# Patient Record
Sex: Male | Born: 1960
Health system: Southern US, Community
[De-identification: ages and names within clinical notes are randomized; demographics above are authoritative.]

## PROBLEM LIST (undated history)

## (undated) DIAGNOSIS — I82409 Acute embolism and thrombosis of unspecified deep veins of unspecified lower extremity: Secondary | ICD-10-CM

## (undated) DIAGNOSIS — I10 Essential (primary) hypertension: Secondary | ICD-10-CM

## (undated) HISTORY — PX: OTHER SURGICAL HISTORY: SHX169

## (undated) HISTORY — DX: Essential (primary) hypertension: I10

---

## 2004-03-30 ENCOUNTER — Emergency Department (HOSPITAL_COMMUNITY): Admission: EM | Admit: 2004-03-30 | Discharge: 2004-03-30 | Payer: Self-pay | Admitting: Emergency Medicine

## 2011-01-21 ENCOUNTER — Emergency Department (HOSPITAL_COMMUNITY)
Admission: EM | Admit: 2011-01-21 | Discharge: 2011-01-22 | Disposition: A | Discharge status: Home / Self Care | Payer: 59 | Attending: Emergency Medicine | Admitting: Emergency Medicine

## 2011-01-21 DIAGNOSIS — R05 Cough: Secondary | ICD-10-CM | POA: Insufficient documentation

## 2011-01-21 DIAGNOSIS — I10 Essential (primary) hypertension: Secondary | ICD-10-CM | POA: Insufficient documentation

## 2011-01-21 DIAGNOSIS — R059 Cough, unspecified: Secondary | ICD-10-CM | POA: Insufficient documentation

## 2011-01-21 DIAGNOSIS — R079 Chest pain, unspecified: Secondary | ICD-10-CM | POA: Insufficient documentation

## 2011-01-21 DIAGNOSIS — J40 Bronchitis, not specified as acute or chronic: Secondary | ICD-10-CM | POA: Insufficient documentation

## 2011-01-22 ENCOUNTER — Emergency Department (HOSPITAL_COMMUNITY): Payer: 59

## 2012-07-30 ENCOUNTER — Ambulatory Visit (INDEPENDENT_AMBULATORY_CARE_PROVIDER_SITE_OTHER): Payer: 59 | Admitting: Family Medicine

## 2012-07-30 VITALS — BP 162/102 | HR 59 | Temp 97.6°F | Resp 18 | Ht 67.0 in | Wt 301.4 lb

## 2012-07-30 DIAGNOSIS — M79669 Pain in unspecified lower leg: Secondary | ICD-10-CM

## 2012-07-30 DIAGNOSIS — L0390 Cellulitis, unspecified: Secondary | ICD-10-CM

## 2012-07-30 DIAGNOSIS — R600 Localized edema: Secondary | ICD-10-CM

## 2012-07-30 DIAGNOSIS — I10 Essential (primary) hypertension: Secondary | ICD-10-CM

## 2012-07-30 DIAGNOSIS — R609 Edema, unspecified: Secondary | ICD-10-CM

## 2012-07-30 DIAGNOSIS — M79609 Pain in unspecified limb: Secondary | ICD-10-CM

## 2012-07-30 LAB — POCT CBC
MCH, POC: 27.9 pg (ref 27–31.2)
MCHC: 30.6 g/dL — AB (ref 31.8–35.4)
MCV: 91.3 fL (ref 80–97)
POC MID %: 7 %M (ref 0–12)
Platelet Count, POC: 260 10*3/uL (ref 142–424)
RBC: 4.94 M/uL (ref 4.69–6.13)
RDW, POC: 15.2 %

## 2012-07-30 LAB — COMPREHENSIVE METABOLIC PANEL
ALT: 9 U/L (ref 0–53)
AST: 16 U/L (ref 0–37)
Albumin: 4 g/dL (ref 3.5–5.2)
BUN: 13 mg/dL (ref 6–23)
CO2: 29 mEq/L (ref 19–32)
Calcium: 9.4 mg/dL (ref 8.4–10.5)
Chloride: 105 mEq/L (ref 96–112)
Glucose, Bld: 90 mg/dL (ref 70–99)
Potassium: 4.2 mEq/L (ref 3.5–5.3)
Total Protein: 7.6 g/dL (ref 6.0–8.3)

## 2012-07-30 MED ORDER — CEPHALEXIN 500 MG PO CAPS: 500.0000 mg | ORAL_CAPSULE | Freq: Four times a day (QID) | ORAL | Status: DC

## 2012-07-30 MED ORDER — AMLODIPINE-VALSARTAN-HCTZ 10-160-25 MG PO TABS: ORAL_TABLET | ORAL | Status: DC

## 2012-07-30 NOTE — Progress Notes (Signed)
Subjective: Patient has had cellulitis developing in his left ankle for for 5 days. He has been out of his blood pressure medication for over a week, waiting for the mail order prescription to come in. His legs have been swelling more. He develops spontaneously erythema of the left shin. He had a cellulitis that once in the past. Knows of no injury. 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 He is on blood pressure medications and not on any other regular meds. He the area of erythema is painful, but there is no pain going up the leg above that.    Objective: Obese Afro-American male in no acute distress. No calf or thigh tenderness. He is tender in the left shin area. There is 2+ pitting edema. Pedal pulses present. Sensory grossly intact.  Assessment: Cellulitis left lower leg Obesity Hypertension uncontrolled  Plan: Need to get back on his blood pressure medicine. He is to get the prescription I gave him filled if his mail order prescriptions and recommend.  Check CBC and complete metabolic panel on him  Results for orders placed in visit on 07/30/12  POCT CBC      Component Value Range   WBC 8.2  4.6 - 10.2 K/uL   Lymph, poc 2.3  0.6 - 3.4   POC LYMPH PERCENT 28.2  10 - 50 %L   MID (cbc) 0.6  0 - 0.9   POC MID % 7.0  0 - 12 %M   POC Granulocyte 5.3  2 - 6.9   Granulocyte percent 64.8  37 - 80 %G   RBC 4.94  4.69 - 6.13 M/uL   Hemoglobin 13.8 (*) 14.1 - 18.1 g/dL   HCT, POC 45.4  09.8 - 53.7 %   MCV 91.3  80 - 97 fL   MCH, POC 27.9  27 - 31.2 pg   MCHC 30.6 (*) 31.8 - 35.4 g/dL   RDW, POC 11.9     Platelet Count, POC 260  142 - 424 K/uL   MPV 9.3  0 - 99.8 fL

## 2012-07-30 NOTE — Patient Instructions (Addendum)
Try to keep leg elevated as much as possible.  Take the antibiotic one pill 3 times daily  If not looking much better in the next 2-3 days return for recheck. If looking worse at anytime return.

## 2012-08-02 ENCOUNTER — Encounter: Payer: Self-pay | Admitting: Family Medicine

## 2012-09-17 ENCOUNTER — Other Ambulatory Visit: Payer: Self-pay | Admitting: Family Medicine

## 2012-09-17 ENCOUNTER — Other Ambulatory Visit: Payer: 59

## 2012-09-17 DIAGNOSIS — R52 Pain, unspecified: Secondary | ICD-10-CM

## 2012-09-17 DIAGNOSIS — R609 Edema, unspecified: Secondary | ICD-10-CM

## 2012-09-18 ENCOUNTER — Other Ambulatory Visit: Payer: 59

## 2012-09-24 ENCOUNTER — Ambulatory Visit
Admission: RE | Admit: 2012-09-24 | Discharge: 2012-09-24 | Disposition: A | Discharge status: Home / Self Care | Payer: 59 | Source: Ambulatory Visit | Attending: Family Medicine | Admitting: Family Medicine

## 2012-09-24 DIAGNOSIS — R52 Pain, unspecified: Secondary | ICD-10-CM

## 2012-09-24 DIAGNOSIS — R609 Edema, unspecified: Secondary | ICD-10-CM

## 2013-10-04 ENCOUNTER — Ambulatory Visit (INDEPENDENT_AMBULATORY_CARE_PROVIDER_SITE_OTHER): Payer: 59 | Admitting: Family Medicine

## 2013-10-04 ENCOUNTER — Ambulatory Visit: Payer: 59

## 2013-10-04 ENCOUNTER — Encounter: Payer: Self-pay | Admitting: Family Medicine

## 2013-10-04 VITALS — BP 162/90 | HR 102 | Temp 100.1°F | Resp 17 | Ht 67.0 in | Wt 315.0 lb

## 2013-10-04 DIAGNOSIS — R509 Fever, unspecified: Secondary | ICD-10-CM

## 2013-10-04 DIAGNOSIS — R05 Cough: Secondary | ICD-10-CM

## 2013-10-04 DIAGNOSIS — R059 Cough, unspecified: Secondary | ICD-10-CM

## 2013-10-04 DIAGNOSIS — E669 Obesity, unspecified: Secondary | ICD-10-CM

## 2013-10-04 DIAGNOSIS — I10 Essential (primary) hypertension: Secondary | ICD-10-CM

## 2013-10-04 DIAGNOSIS — J189 Pneumonia, unspecified organism: Secondary | ICD-10-CM

## 2013-10-04 LAB — POCT CBC
Granulocyte percent: 88.5 %G — AB (ref 37–80)
HCT, POC: 48.3 % (ref 43.5–53.7)
Hemoglobin: 15 g/dL (ref 14.1–18.1)
LYMPH, POC: 0.9 (ref 0.6–3.4)
MCH: 28.5 pg (ref 27–31.2)
MCHC: 31.1 g/dL — AB (ref 31.8–35.4)
MCV: 91.8 fL (ref 80–97)
MID (cbc): 0.5 (ref 0–0.9)
MPV: 10.2 fL (ref 0–99.8)
POC GRANULOCYTE: 10.9 — AB (ref 2–6.9)
POC LYMPH %: 7.5 % — AB (ref 10–50)
POC MID %: 4 %M (ref 0–12)
Platelet Count, POC: 188 10*3/uL (ref 142–424)
RBC: 5.26 M/uL (ref 4.69–6.13)
RDW, POC: 15.4 %
WBC: 12.3 10*3/uL — AB (ref 4.6–10.2)

## 2013-10-04 LAB — POCT INFLUENZA A/B
INFLUENZA A, POC: NEGATIVE
INFLUENZA B, POC: NEGATIVE

## 2013-10-04 MED ORDER — ACETAMINOPHEN 325 MG PO TABS: 1000.0000 mg | ORAL_TABLET | Freq: Once | ORAL | Status: DC

## 2013-10-04 MED ORDER — CEFTRIAXONE SODIUM 1 G IJ SOLR
1.0000 g | Freq: Once | INTRAMUSCULAR | Status: AC
  Administered 2013-10-04: 1 g via INTRAMUSCULAR

## 2013-10-04 MED ORDER — LEVOFLOXACIN 500 MG PO TABS: 500.0000 mg | ORAL_TABLET | Freq: Every day | ORAL | Status: DC

## 2013-10-04 NOTE — Progress Notes (Signed)
[facility] [address] [place] [address] [phone]  Date:  10/04/2013   Name:  Richard Leon   DOB:  [birth year]   MRN:  [id number]  PCP:  Provider Not In System    Chief Complaint: Fever, Chills, Generalized Body Aches and Nausea   History of Present Illness:  Richard Leon is a 53 y.o. very pleasant male patient who presents with the following:  Here today with illness. History of HTN and obesity but otherwise generally healthy.  He has noted sneezing, cough, HA, chills, fever, diarrhea, nausea (no vomiting).  These sx started 3 days ago.  No vomiting. He was not sure if he might have the flu or something else.   He has not taken any meds today.  Here today with his wife  Never a smoker  Given 1,000 mg of tylenol at clinic   There are no active problems to display for this patient.   Past Medical History  Diagnosis Date  . Hypertension     History reviewed. No pertinent past surgical history.  History  Substance Use Topics  . Smoking status: Never Smoker   . Smokeless tobacco: Not on file  . Alcohol Use: No    Family History  Problem Relation Age of Onset  . Arthritis Mother   . Hypertension Mother   . COPD Mother   . Kidney disease Sister   . Cancer Brother     No Known Allergies  Medication list has been reviewed and updated.  Current Outpatient Prescriptions on File Prior to Visit  Medication Sig Dispense Refill  . Amlodipine-Valsartan-HCTZ (EXFORGE HCT PO) Take by mouth daily. Patient takes 5-160-20 mg tablet po daily      . Amlodipine-Valsartan-HCTZ 10-160-25 MG TABS Take one daily for blood pressure  15 tablet  0  . cephALEXin (KEFLEX) 500 MG capsule Take 1 capsule (500 mg total) by mouth 4 (four) times daily.  30 capsule  0   No current facility-administered medications on file prior to visit.    Review of Systems:  As per HPI- otherwise negative.   Physical Examination: Filed Vitals:   10/04/13 0937   BP: 162/90  Pulse: 108  Temp: 102.1 F (38.9 C)  Resp: 17   Filed Vitals:   10/04/13 0937  Height: 5\' 7"  (1.702 m)  Weight: 315 lb (142.883 kg)   Body mass index is 49.32 kg/(m^2). Ideal Body Weight: Weight in (lb) to have BMI = 25: 159.3  GEN: WDWN, NAD, Non-toxic, A & O x 3, obese HEENT: Atraumatic, Normocephalic. Neck supple. No masses, No LAD.  Bilateral TM wnl, oropharynx normal.  PEERL,EOMI.   Ears and Nose: No external deformity. CV: RRR, No M/G/R. No JVD. No thrill. No extra heart sounds. PULM: CTA B, no wheezes, crackles, rhonchi. No retractions. No resp. distress. No accessory muscle use. ABD: S, NT, ND, +BS. No rebound. No HSM. EXTR: No c/c/e NEURO Normal gait.  PSYCH: Normally interactive. Conversant. Not depressed or anxious appearing.  Calm demeanor.   [facility] reading (PRIMARY) by  Dr. Patsy Lager. CXR: right lower lobe pneumonia  CHEST 2 VIEW  COMPARISON: 01/22/2011  FINDINGS: Enlargement of cardiac silhouette.  Tortuous aorta.  Sub pulmonary vascular congestion.  Mediastinal contours otherwise stable, with slight prominence of right superior mediastinal soft tissues unchanged.  Minimal atelectasis versus infiltrate at both lung bases.  Remaining lungs clear.  No pleural effusion or pneumothorax.  Multilevel endplate spur formation thoracic spine.  IMPRESSION: Enlargement  of cardiac silhouette.  Minimal atelectasis versus infiltrate at lung bases.  Results for orders placed in visit on 10/04/13  POCT INFLUENZA A/B      Result Value Range   Influenza A, POC Negative     Influenza B, POC Negative    POCT CBC      Result Value Range   WBC 12.3 (*) 4.6 - 10.2 K/uL   Lymph, poc 0.9  0.6 - 3.4   POC LYMPH PERCENT 7.5 (*) 10 - 50 %L   MID (cbc) 0.5  0 - 0.9   POC MID % 4.0  0 - 12 %M   POC Granulocyte 10.9 (*) 2 - 6.9   Granulocyte percent 88.5 (*) 37 - 80 %G   RBC 5.26  4.69 - 6.13 M/uL   Hemoglobin 15.0  14.1 - 18.1 g/dL   HCT, POC 16.148.3  09.643.5  - 53.7 %   MCV 91.8  80 - 97 fL   MCH, POC 28.5  27 - 31.2 pg   MCHC 31.1 (*) 31.8 - 35.4 g/dL   RDW, POC 04.515.4     Platelet Count, POC 188  142 - 424 K/uL   MPV 10.2  0 - 99.8 fL   Pulse and temp better after tylenol Assessment and Plan: CAP (community acquired pneumonia) - Plan: cefTRIAXone (ROCEPHIN) injection 1 g, levofloxacin (LEVAQUIN) 500 MG tablet  Fever, unspecified - Plan: POCT Influenza A/B, acetaminophen (TYLENOL) tablet 975 mg, POCT CBC, DG Chest 2 View  Cough - Plan: DG Chest 2 View  HTN (hypertension)  Obesity, unspecified  CAP treated with rocephin and levaquin.  He is not aware of any cardiac problems.   Close follow-up if not better.    Signed Abbe AmsterdamJessica Lorenzo Pereyra, MD

## 2013-10-04 NOTE — Patient Instructions (Addendum)
It does appear that you have pneumonia.  Rest and drink plenty of fluids.  If you are not feeling better in the next couple of days please let me know.  If you are getting worse please come in right away.    Use OTC medications as needed for your cough and fever You got a shot of antibiotics called rocephin today. Start taking your levaquin oral antibiotic this evening.

## 2013-12-11 ENCOUNTER — Other Ambulatory Visit (HOSPITAL_COMMUNITY): Payer: Self-pay | Admitting: Internal Medicine

## 2013-12-11 ENCOUNTER — Ambulatory Visit (HOSPITAL_COMMUNITY)
Admission: RE | Admit: 2013-12-11 | Discharge: 2013-12-11 | Disposition: A | Discharge status: Home / Self Care | Payer: 59 | Source: Ambulatory Visit | Attending: Internal Medicine | Admitting: Internal Medicine

## 2013-12-11 DIAGNOSIS — R52 Pain, unspecified: Secondary | ICD-10-CM

## 2013-12-11 DIAGNOSIS — M25469 Effusion, unspecified knee: Secondary | ICD-10-CM | POA: Insufficient documentation

## 2013-12-11 DIAGNOSIS — M25569 Pain in unspecified knee: Secondary | ICD-10-CM | POA: Insufficient documentation

## 2014-01-02 ENCOUNTER — Encounter: Payer: Self-pay | Admitting: Internal Medicine

## 2014-01-23 ENCOUNTER — Encounter (HOSPITAL_COMMUNITY): Payer: Self-pay | Admitting: Emergency Medicine

## 2014-01-23 ENCOUNTER — Emergency Department (HOSPITAL_COMMUNITY)
Admission: EM | Admit: 2014-01-23 | Discharge: 2014-01-23 | Disposition: A | Discharge status: Home / Self Care | Payer: 59 | Attending: Emergency Medicine | Admitting: Emergency Medicine

## 2014-01-23 ENCOUNTER — Emergency Department (HOSPITAL_COMMUNITY): Payer: 59

## 2014-01-23 DIAGNOSIS — M1711 Unilateral primary osteoarthritis, right knee: Secondary | ICD-10-CM

## 2014-01-23 DIAGNOSIS — M1710 Unilateral primary osteoarthritis, unspecified knee: Secondary | ICD-10-CM | POA: Insufficient documentation

## 2014-01-23 DIAGNOSIS — IMO0002 Reserved for concepts with insufficient information to code with codable children: Secondary | ICD-10-CM

## 2014-01-23 DIAGNOSIS — R5381 Other malaise: Secondary | ICD-10-CM | POA: Insufficient documentation

## 2014-01-23 DIAGNOSIS — R55 Syncope and collapse: Secondary | ICD-10-CM | POA: Insufficient documentation

## 2014-01-23 DIAGNOSIS — I517 Cardiomegaly: Secondary | ICD-10-CM | POA: Insufficient documentation

## 2014-01-23 DIAGNOSIS — Z79899 Other long term (current) drug therapy: Secondary | ICD-10-CM | POA: Insufficient documentation

## 2014-01-23 DIAGNOSIS — I10 Essential (primary) hypertension: Secondary | ICD-10-CM | POA: Insufficient documentation

## 2014-01-23 DIAGNOSIS — E663 Overweight: Secondary | ICD-10-CM | POA: Insufficient documentation

## 2014-01-23 DIAGNOSIS — R110 Nausea: Secondary | ICD-10-CM | POA: Insufficient documentation

## 2014-01-23 DIAGNOSIS — E669 Obesity, unspecified: Secondary | ICD-10-CM | POA: Insufficient documentation

## 2014-01-23 DIAGNOSIS — Z791 Long term (current) use of non-steroidal anti-inflammatories (NSAID): Secondary | ICD-10-CM | POA: Insufficient documentation

## 2014-01-23 DIAGNOSIS — R61 Generalized hyperhidrosis: Secondary | ICD-10-CM | POA: Insufficient documentation

## 2014-01-23 DIAGNOSIS — R5383 Other fatigue: Secondary | ICD-10-CM

## 2014-01-23 LAB — URINALYSIS, ROUTINE W REFLEX MICROSCOPIC
BILIRUBIN URINE: NEGATIVE
GLUCOSE, UA: NEGATIVE mg/dL
Hgb urine dipstick: NEGATIVE
Ketones, ur: NEGATIVE mg/dL
Leukocytes, UA: NEGATIVE
Nitrite: NEGATIVE
PH: 5.5 (ref 5.0–8.0)
PROTEIN: NEGATIVE mg/dL
Specific Gravity, Urine: 1.015 (ref 1.005–1.030)
Urobilinogen, UA: 1 mg/dL (ref 0.0–1.0)

## 2014-01-23 LAB — HEPATIC FUNCTION PANEL
ALT: 10 U/L (ref 0–53)
AST: 16 U/L (ref 0–37)
Albumin: 3.3 g/dL — ABNORMAL LOW (ref 3.5–5.2)
Alkaline Phosphatase: 72 U/L (ref 39–117)
Total Bilirubin: 0.5 mg/dL (ref 0.3–1.2)
Total Protein: 7.2 g/dL (ref 6.0–8.3)

## 2014-01-23 LAB — I-STAT TROPONIN, ED
Troponin i, poc: 0 ng/mL (ref 0.00–0.08)
Troponin i, poc: 0 ng/mL (ref 0.00–0.08)

## 2014-01-23 LAB — CBC
HCT: 41.2 % (ref 39.0–52.0)
Hemoglobin: 13.7 g/dL (ref 13.0–17.0)
MCH: 28.8 pg (ref 26.0–34.0)
MCHC: 33.3 g/dL (ref 30.0–36.0)
MCV: 86.7 fL (ref 78.0–100.0)
Platelets: 185 10*3/uL (ref 150–400)
RBC: 4.75 MIL/uL (ref 4.22–5.81)
RDW: 15.2 % (ref 11.5–15.5)
WBC: 7.9 10*3/uL (ref 4.0–10.5)

## 2014-01-23 LAB — BASIC METABOLIC PANEL
BUN: 16 mg/dL (ref 6–23)
CALCIUM: 9.1 mg/dL (ref 8.4–10.5)
CHLORIDE: 102 meq/L (ref 96–112)
CO2: 27 meq/L (ref 19–32)
CREATININE: 0.97 mg/dL (ref 0.50–1.35)
GFR calc Af Amer: >90 mL/min (ref >90)
GFR calc non Af Amer: >90 mL/min (ref >90)
GLUCOSE: 86 mg/dL (ref 70–99)
Potassium: 3.8 mEq/L (ref 3.7–5.3)
Sodium: 139 mEq/L (ref 137–147)

## 2014-01-23 LAB — CBG MONITORING, ED: Glucose-Capillary: 81 mg/dL (ref 70–99)

## 2014-01-23 LAB — PRO B NATRIURETIC PEPTIDE: Pro B Natriuretic peptide (BNP): 12.8 pg/mL (ref 0–125)

## 2014-01-23 LAB — PROTIME-INR
INR: 0.94 (ref 0.00–1.49)
Prothrombin Time: 12.4 seconds (ref 11.6–15.2)

## 2014-01-23 MED ORDER — SODIUM CHLORIDE 0.9 % IV SOLN
1000.0000 mL | Freq: Once | INTRAVENOUS | Status: AC
  Administered 2014-01-23: 1000 mL via INTRAVENOUS

## 2014-01-23 MED ORDER — SODIUM CHLORIDE 0.9 % IV SOLN
1000.0000 mL | INTRAVENOUS | Status: DC
  Administered 2014-01-23: 1000 mL via INTRAVENOUS

## 2014-01-23 NOTE — ED Notes (Signed)
Pt from work, states near syncopal episode during Loss adjuster, charteredfire drill. Per EMS pt nauseated, weak and diaphoretic upon there arrival, No LOC. Pt initial cbg 64. NSD at this time

## 2014-01-23 NOTE — ED Provider Notes (Signed)
CSN: [id number]     Arrival date & time 01/23/14  1158 History   First MD Initiated Contact with Patient 01/23/14 1201     Chief Complaint  Patient presents with  . Near Syncope     (Consider location/radiation/quality/duration/timing/severity/associated sxs/prior Treatment)  HPI  Skip EstimableStevenson L Richard Leon is a 53 y.o. male with past medical history of hypertension brought in by EMS after near syncopal event. Patient was at work and had a Loss adjuster, charteredfire drill where they were waiting outside in the heat for about 15 minutes. In the elevator ride back into his office he felt weak, sweaty, nauseous and was gently eased to the floor. The sensation passed on its own. He denies any palpitations, chest pain, shortness of breath, nausea vomiting. Patient has not had any cardiac history. Has never had a stress test.  PCP [place]ue[place]  Past Medical History  Diagnosis Date  . Hypertension    History reviewed. No pertinent past surgical history. Family History  Problem Relation Age of Onset  . Arthritis Mother   . Hypertension Mother   . COPD Mother   . Kidney disease Sister   . Cancer Brother    History  Substance Use Topics  . Smoking status: Never Smoker   . Smokeless tobacco: Not on file  . Alcohol Use: No    Review of Systems  10 systems reviewed and found to be negative, except as noted in the HPI.  Allergies  Review of patient's allergies indicates no known allergies.  Home Medications   Prior to Admission medications   Medication Sig Start Date End Date Taking? Authorizing Provider  Amlodipine-Valsartan-HCTZ (EXFORGE HCT PO) Take by mouth daily. Patient takes 5-160-20 mg tablet po daily   Yes Historical Provider, MD  meloxicam (MOBIC) 15 MG tablet Take 15 mg by mouth daily.   Yes Historical Provider, MD   BP 149/74  Pulse 66  Temp(Src) 98.4 F (36.9 C) (Oral)  Resp 17  SpO2 100% Physical Exam  Nursing note and vitals reviewed. Constitutional: He is oriented to person, place, and time.  He appears well-developed and well-nourished. No distress.  overweight  HENT:  Head: Normocephalic.  Mouth/Throat: Oropharynx is clear and moist.  Eyes: Conjunctivae and EOM are normal. Pupils are equal, round, and reactive to light.  Neck: Normal range of motion. Neck supple.  Cardiovascular: Normal rate, regular rhythm, normal heart sounds and intact distal pulses.   Pulmonary/Chest: Effort normal and breath sounds normal. No stridor. No respiratory distress. He has no wheezes. He has no rales. He exhibits no tenderness.  Abdominal: Soft. Bowel sounds are normal. He exhibits no distension and no mass. There is no tenderness. There is no rebound and no guarding.  Musculoskeletal: Normal range of motion. He exhibits no edema and no tenderness.  Neurological: He is alert and oriented to person, place, and time.  Skin: Skin is warm.  Psychiatric: He has a normal mood and affect.    ED Course  Procedures (including critical care time) Labs Review Labs Reviewed  HEPATIC FUNCTION PANEL - Abnormal; Notable for the following:    Albumin 3.3 (*)    All other components within normal limits  CBC  BASIC METABOLIC PANEL  PROTIME-INR  URINALYSIS, ROUTINE W REFLEX MICROSCOPIC  PRO B NATRIURETIC PEPTIDE  CBG MONITORING, ED  I-STAT TROPOININ, ED  POCT CBG (FASTING - GLUCOSE)-MANUAL ENTRY  Rosezena SensorI-STAT TROPOININ, ED    Imaging Review Dg Chest Port 1 View  01/23/2014   CLINICAL DATA:  Shortness of breath.  EXAM: PORTABLE CHEST - 1 VIEW  COMPARISON:  Chest x-ray 10/04/2013.  FINDINGS: Cardiomegaly or pulmonary vascular prominence. Mild interstitial prominence. Mild component of interstitial pulmonary edema cannot be excluded. Basilar atelectasis. No pleural effusion or pneumothorax PICC  IMPRESSION: 1. Severe cardiomegaly. 2. Congestive heart failure with mild of interstitial edema .   Electronically Signed   By: Maisie Fushomas  Register   On: 01/23/2014 13:15     EKG Interpretation   Date/Time:  Thursday  Jan 23 2014 12:06:21 EDT Ventricular Rate:  68 PR Interval:  170 QRS Duration: 81 QT Interval:  418 QTC Calculation: 444 R Axis:   21 Text Interpretation:  Sinus rhythm Low voltage, precordial leads  Borderline T abnormalities, inferior leads No previous tracing Confirmed  by POLLINA  MD, CHRISTOPHER [phone]([id number]) on 01/23/2014 12:49:44 PM      MDM   Final diagnoses:  Syncope  Cardiomegaly  HTN (hypertension)  Obesity, unspecified  Arthritis of right knee    Filed Vitals:   01/23/14 1323 01/23/14 1324 01/23/14 1515 01/23/14 1530  BP: 134/76 131/92 129/82 149/74  Pulse: 64 68 66 66  Temp:      TempSrc:      Resp:   20 17  SpO2:   100% 100%    Medications  0.9 %  sodium chloride infusion (0 mLs Intravenous Stopped 01/23/14 1520)    Followed by  0.9 %  sodium chloride infusion (1,000 mLs Intravenous New Bag/Given 01/23/14 1318)    [name] is a 53 y.o. male presenting with presyncopal episode. Patient denies chest pain or shortness of breath. EKG shows T wave inversion in lead 3 no prior to compare. Troponin blood work unremarkable. Chest x-ray with severe cardiomegaly, CHF with mild interstitial edema. BNP ordered. BNP result is within normal limits. Case discussed with patient and his wife. Encourage close followup with cardiology. Very low threshold to return to ED. Will get delta troponin before discharge. Using the [facility] syncope rule even with congestive heart failure and assuming the EKG T-wave inversions are new patient is low risk and appropriate for discharge to home. Encourage patient to start daily 81 mg aspirin  Evaluation does not show pathology that would require ongoing emergent intervention or inpatient treatment. Pt is hemodynamically stable and mentating appropriately. Discussed findings and plan with patient/guardian, who agrees with care plan. All questions answered. Return precautions discussed and outpatient follow up given.   Note: Portions of this  report may have been transcribed using voice recognition software. Every effort was made to ensure accuracy; however, inadvertent computerized transcription errors may be present     Wynetta Emeryicole Ulysee Fyock, PA-C 01/23/14 1644  Patient reports fall last week with right knee swelling and pain. X-ray shows DJD. Pt refuses crutches and knee immobilizer.   Wynetta Emeryicole Dravin Lance, PA-C 01/23/14 [phone]

## 2014-01-23 NOTE — Discharge Instructions (Signed)
Start taking daily 81mg  Aspirin and follow with cardiology in the next 48 hours.   Please follow with your primary care doctor in the next 2 days for a check-up. They must obtain records for further management.   Do not hesitate to return to the Emergency Department for any new, worsening or concerning symptoms.    Near-Syncope Near-syncope (commonly known as near fainting) is sudden weakness, dizziness, or feeling like you might pass out. During an episode of near-syncope, you may also develop pale skin, have tunnel vision, or feel sick to your stomach (nauseous). Near-syncope may occur when getting up after sitting or while standing for a long time. It is caused by a sudden decrease in blood flow to the brain. This decrease can result from various causes or triggers, most of which are not serious. However, because near-syncope can sometimes be a sign of something serious, a medical evaluation is required. The specific cause is often not determined. HOME CARE INSTRUCTIONS  Monitor your condition for any changes. The following actions may help to alleviate any discomfort you are experiencing:  Have someone stay with you until you feel stable.  Lie down right away if you start feeling like you might faint. Breathe deeply and steadily. Wait until all the symptoms have passed. Most of these episodes last only a few minutes. You may feel tired for several hours.   Drink enough fluids to keep your urine clear or pale yellow.   If you are taking blood pressure or heart medicine, get up slowly when seated or lying down. Take several minutes to sit and then stand. This can reduce dizziness.  Follow up with your health care provider as directed. SEEK IMMEDIATE MEDICAL CARE IF:   You have a severe headache.   You have unusual pain in the chest, abdomen, or back.   You are bleeding from the mouth or rectum, or you have black or tarry stool.   You have an irregular or very fast heartbeat.    You have repeated fainting or have seizure-like jerking during an episode.   You faint when sitting or lying down.   You have confusion.   You have difficulty walking.   You have severe weakness.   You have vision problems.  MAKE SURE YOU:   Understand these instructions.  Will watch your condition.  Will get help right away if you are not doing well or get worse. Document Released: 09/05/2005 Document Revised: 05/08/2013 Document Reviewed: 02/08/2013 [facility] Patient Information 2014 [place] [place].

## 2014-01-23 NOTE — ED Provider Notes (Signed)
Medical screening examination/treatment/procedure(s) were conducted as a shared visit with non-physician practitioner(s) and myself.  I personally evaluated the patient during the encounter.   EKG Interpretation   Date/Time:  Thursday Jan 23 2014 12:06:21 EDT Ventricular Rate:  68 PR Interval:  170 QRS Duration: 81 QT Interval:  418 QTC Calculation: 444 R Axis:   21 Text Interpretation:  Sinus rhythm Low voltage, precordial leads  Borderline T abnormalities, inferior leads No previous tracing Confirmed  by POLLINA  MD, CHRISTOPHER [phone]([id number]) on 01/23/2014 12:49:44 PM      Patient was sent to ER for evaluation of near syncopal episode. Patient reports that he go outside for a fire drill, was standing in the heat when he became very dizzy and thought he was going to pass out. This indicates that he had a fall last week and has been having pain and swelling of his right knee. Right before he had onset of symptoms his knee started to hurt more than usual as well. His workup has been unremarkable. Patient is stable, normal vital signs. He is appropriate for discharge to followup with primary care doctor in the office, return if symptoms worsen.  Gilda Creasehristopher J. Pollina, MD 01/23/14 [phone]

## 2014-02-07 ENCOUNTER — Encounter: Payer: 59 | Admitting: Internal Medicine

## 2014-02-12 ENCOUNTER — Encounter: Payer: Self-pay | Admitting: *Deleted

## 2014-02-13 ENCOUNTER — Encounter: Payer: Self-pay | Admitting: Cardiology

## 2014-02-13 ENCOUNTER — Ambulatory Visit (INDEPENDENT_AMBULATORY_CARE_PROVIDER_SITE_OTHER): Payer: 59 | Admitting: Cardiology

## 2014-02-13 VITALS — BP 124/82 | HR 73 | Ht 67.0 in | Wt 325.2 lb

## 2014-02-13 DIAGNOSIS — R9389 Abnormal findings on diagnostic imaging of other specified body structures: Secondary | ICD-10-CM | POA: Insufficient documentation

## 2014-02-13 DIAGNOSIS — R918 Other nonspecific abnormal finding of lung field: Secondary | ICD-10-CM

## 2014-02-13 DIAGNOSIS — I10 Essential (primary) hypertension: Secondary | ICD-10-CM

## 2014-02-13 DIAGNOSIS — R42 Dizziness and giddiness: Secondary | ICD-10-CM | POA: Insufficient documentation

## 2014-02-13 DIAGNOSIS — I517 Cardiomegaly: Secondary | ICD-10-CM

## 2014-02-13 NOTE — Patient Instructions (Signed)
The current medical regimen is effective;  continue present plan and medications.  Your physician has requested that you have an echocardiogram. Echocardiography is a painless test that uses sound waves to create images of your heart. It provides your doctor with information about the size and shape of your heart and how well your heart's chambers and valves are working. This procedure takes approximately one hour. There are no restrictions for this procedure.  Follow up as needed. 

## 2014-02-13 NOTE — Progress Notes (Signed)
HPI The patient was in the ED on 5/4 after a near syncopal episode.  This was thought to be related to the fact that he had been standing outside in the heat during a fire drill at work.  However, in the ED he was found on CXR to have "severe" cardiomegaly" with mild CHF.  He denies prior cardiovascular symptoms. He reports that he was at 2 months ago prior to injuring his knee.  Even now he can climb 3 flights of stairs and says he has no shortness of breath. He does have chronic lower swelling swelling but he's been told he has some left leg venous insufficiency.  He says this is baseline. He denies any chest pressure, neck or arm discomfort. He has had no palpitations, presyncope syncope. He denies any PND or orthopnea.  Of note he has had no syncope before or since this event.    No Known Allergies  Current Outpatient Prescriptions  Medication Sig Dispense Refill  . Amlodipine-Valsartan-HCTZ (EXFORGE HCT PO) Take by mouth daily. Patient takes 5-160-20 mg tablet po daily      . aspirin EC 81 MG tablet Take 81 mg by mouth daily.      Marland Kitchen. glucosamine-chondroitin 500-400 MG tablet Take 1 tablet by mouth once.      Marland Kitchen. ibuprofen (ADVIL,MOTRIN) 200 MG tablet Take 200 mg by mouth 1 day or 1 dose.      . meloxicam (MOBIC) 15 MG tablet Take 15 mg by mouth daily.       Current Facility-Administered Medications  Medication Dose Route Frequency Provider Last Rate Last Dose  . acetaminophen (TYLENOL) tablet 975 mg  975 mg Oral Once Pearline CablesJessica C Copland, MD        Past Medical History  Diagnosis Date  . Hypertension     Past Surgical History  Procedure Laterality Date  . None      Family History  Problem Relation Age of Onset  . Arthritis Mother   . Hypertension Mother   . COPD Mother   . Kidney disease Sister   . Cancer Brother     History   Social History  . Marital Status: Married    Spouse Name: N/A    Number of Children: 3  . Years of Education: N/A   Occupational History  .   Lab [employer]   Social History Main Topics  . Smoking status: Never Smoker   . Smokeless tobacco: Not on file  . Alcohol Use: No  . Drug Use: No  . Sexual Activity: Yes    Partners: Female     Comment: Wife is the only sexual partner   Other Topics Concern  . Not on file   Social History Narrative   Lives with wife.      ROS:  Positive for right knee pain. Otherwise as stated in the HPI and negative for all other systems.  PHYSICAL EXAM BP 124/82  Pulse 73  Ht 5\' 7"  (1.702 m)  Wt 325 lb 3.2 oz (147.51 kg)  BMI 50.92 kg/m2 GENERAL:  Well appearing HEENT:  Pupils equal round and reactive, fundi not visualized, oral mucosa unremarkable NECK:  JVD 8 cm with HJR at 45 degrees, waveform within normal limits, carotid upstroke brisk and symmetric, no bruits, no thyromegaly LYMPHATICS:  No cervical, inguinal adenopathy LUNGS:  Clear to auscultation bilaterally BACK:  No CVA tenderness CHEST:  Unremarkable HEART:  PMI not displaced or sustained,S1 and S2 within normal limits, no S3, no S4,  no clicks, no rubs, no murmurs ABD:  Flat, positive bowel sounds normal in frequency in pitch, no bruits, no rebound, no guarding, no midline pulsatile mass, no hepatomegaly, no splenomegaly, obese EXT:  2 plus pulses throughout, left greater than right leg edema, no cyanosis no clubbing SKIN:  No rashes no nodules NEURO:  Cranial nerves II through XII grossly intact, motor grossly intact throughout PSYCH:  Cognitively intact, oriented to person place and time   EKG:  Sinus rhythm, rate 73, axis within normal limits, intervals within normal limits, no acute ST-T wave changes. 02/13/2014  ASSESSMENT AND PLAN  CARDIOMEGALY:  He has no overt symptoms. I did review the chest x-ray and it is difficult to assess.  I will check an echocardiogram.  If this is normal no further workup will be planned.  HTN:  The blood pressure is at target. No change in medications is indicated. We will continue with  therapeutic lifestyle changes (TLC).  OBESITY:  The patient understands the need to lose weight with diet and exercise (which will be limited for now)as well. We have discussed specific strategies for this.

## 2014-03-06 ENCOUNTER — Ambulatory Visit (HOSPITAL_COMMUNITY): Payer: 59 | Attending: Cardiology | Admitting: Radiology

## 2014-03-06 DIAGNOSIS — I517 Cardiomegaly: Secondary | ICD-10-CM

## 2014-03-06 DIAGNOSIS — R55 Syncope and collapse: Secondary | ICD-10-CM

## 2014-03-06 DIAGNOSIS — I10 Essential (primary) hypertension: Secondary | ICD-10-CM | POA: Insufficient documentation

## 2014-03-06 NOTE — Progress Notes (Signed)
Echocardiogram performed.  

## 2014-03-13 ENCOUNTER — Telehealth: Payer: Self-pay | Admitting: Cardiology

## 2014-03-13 NOTE — Telephone Encounter (Signed)
Reviewed results of recent testing with pt who states understanding.

## 2014-03-13 NOTE — Telephone Encounter (Signed)
Follow Up  Pt returned call for lab results. Please assist

## 2015-01-10 ENCOUNTER — Ambulatory Visit (INDEPENDENT_AMBULATORY_CARE_PROVIDER_SITE_OTHER): Payer: 59 | Admitting: Emergency Medicine

## 2015-01-10 ENCOUNTER — Ambulatory Visit (HOSPITAL_COMMUNITY)
Admission: RE | Admit: 2015-01-10 | Discharge: 2015-01-10 | Disposition: A | Discharge status: Home / Self Care | Payer: 59 | Source: Ambulatory Visit | Attending: Emergency Medicine | Admitting: Emergency Medicine

## 2015-01-10 VITALS — BP 154/92 | HR 67 | Temp 97.8°F | Resp 18 | Ht 67.0 in | Wt 330.4 lb

## 2015-01-10 DIAGNOSIS — M79662 Pain in left lower leg: Secondary | ICD-10-CM

## 2015-01-10 DIAGNOSIS — I10 Essential (primary) hypertension: Secondary | ICD-10-CM | POA: Diagnosis not present

## 2015-01-10 DIAGNOSIS — I82402 Acute embolism and thrombosis of unspecified deep veins of left lower extremity: Secondary | ICD-10-CM

## 2015-01-10 DIAGNOSIS — I82492 Acute embolism and thrombosis of other specified deep vein of left lower extremity: Secondary | ICD-10-CM | POA: Insufficient documentation

## 2015-01-10 DIAGNOSIS — M7989 Other specified soft tissue disorders: Secondary | ICD-10-CM | POA: Diagnosis not present

## 2015-01-10 LAB — POCT CBC
Granulocyte percent: 65.3 %G (ref 37–80)
HCT, POC: 46.7 % (ref 43.5–53.7)
Hemoglobin: 14.8 g/dL (ref 14.1–18.1)
Lymph, poc: 1.8 (ref 0.6–3.4)
MCH, POC: 27.6 pg (ref 27–31.2)
MCHC: 31.6 g/dL — AB (ref 31.8–35.4)
MCV: 87.2 fL (ref 80–97)
MID (CBC): 0.5 (ref 0–0.9)
MPV: 8.8 fL (ref 0–99.8)
PLATELET COUNT, POC: 190 10*3/uL (ref 142–424)
POC GRANULOCYTE: 4.4 (ref 2–6.9)
POC LYMPH %: 27.5 % (ref 10–50)
POC MID %: 7.2 %M (ref 0–12)
RBC: 5.36 M/uL (ref 4.69–6.13)
RDW, POC: 16.2 %
WBC: 6.7 10*3/uL (ref 4.6–10.2)

## 2015-01-10 MED ORDER — RIVAROXABAN (XARELTO) VTE STARTER PACK (15 & 20 MG): ORAL_TABLET | ORAL | Status: DC

## 2015-01-10 MED ORDER — AMLODIPINE-VALSARTAN-HCTZ 10-160-25 MG PO TABS: 1.0000 | ORAL_TABLET | Freq: Every day | ORAL | Status: DC

## 2015-01-10 MED ORDER — RIVAROXABAN 20 MG PO TABS: 20.0000 mg | ORAL_TABLET | Freq: Every day | ORAL | Status: DC

## 2015-01-10 NOTE — Progress Notes (Signed)
[facility] [address], [place] [id number] [phone]  Date:  01/10/2015   Name:  Richard Leon   DOB:  10/14/1960   MRN:  [id number]  PCP:  Dorrene GermanAVBUERE,EDWIN A, MD    Chief Complaint: Leg Swelling   History of Present Illness:  Richard Leon is a 54 y.o. very pleasant male patient who presents with the following:  Concerned he may have left calf cellulitis Has swelling and fullness and tender posterior mid calf.   Started a week ago and is worse. No history of injury or overuse No travel or immobilization Has run out of BP meds 3 weeks ago and not refilled them No fever or chills.   No improvement with over the counter medications or other home remedies.  Denies other complaint or health concern today.   Patient Active Problem List   Diagnosis Date Noted  . Abnormal CXR 02/13/2014  . Dizziness 02/13/2014  . HTN (hypertension) 10/04/2013  . Obesity, unspecified 10/04/2013    Past Medical History  Diagnosis Date  . Hypertension     Past Surgical History  Procedure Laterality Date  . None      History  Substance Use Topics  . Smoking status: Never Smoker   . Smokeless tobacco: Not on file  . Alcohol Use: No    Family History  Problem Relation Age of Onset  . Arthritis Mother   . Hypertension Mother   . COPD Mother   . Kidney disease Sister   . Cancer Brother     No Known Allergies  Medication list has been reviewed and updated.  Current Outpatient Prescriptions on File Prior to Visit  Medication Sig Dispense Refill  . Amlodipine-Valsartan-HCTZ (EXFORGE HCT PO) Take by mouth daily. Patient takes 5-160-20 mg tablet po daily    . aspirin EC 81 MG tablet Take 81 mg by mouth daily.    Marland Kitchen. glucosamine-chondroitin 500-400 MG tablet Take 1 tablet by mouth once.    Marland Kitchen. ibuprofen (ADVIL,MOTRIN) 200 MG tablet Take 200 mg by mouth 1 day or 1 dose.    . meloxicam (MOBIC) 15 MG tablet Take 15 mg by mouth daily.     Current  Facility-Administered Medications on File Prior to Visit  Medication Dose Route Frequency Provider Last Rate Last Dose  . acetaminophen (TYLENOL) tablet 975 mg  975 mg Oral Once Pearline CablesJessica C Copland, MD        Review of Systems:  As per HPI, otherwise negative.    Physical Examination: Filed Vitals:   01/10/15 1008  BP: 154/92  Pulse: 67  Temp: 97.8 F (36.6 C)  Resp: 18   Filed Vitals:   01/10/15 1008  Height: 5\' 7"  (1.702 m)  Weight: 330 lb 6.4 oz (149.868 kg)   Body mass index is 51.74 kg/(m^2). Ideal Body Weight: Weight in (lb) to have BMI = 25: 159.3   GEN: morbid obesity, NAD, Non-toxic, Alert & Oriented x 3 HEENT: Atraumatic, Normocephalic.  Ears and Nose: No external deformity. EXTR: No clubbing/cyanosis  Has bilateral edema to knee.  Left calf full and tender.  No erythema NEURO: Normal gait.  PSYCH: Normally interactive. Conversant. Not depressed or anxious appearing.  Calm demeanor.    Assessment and Plan: DVT HBP Refill meds Positive venous doppler Discussed options and will start xarelto  Understands risks and benefits F/u in 1 month Signed,  Phillips OdorJeffery Kiahna Banghart, MD

## 2015-01-10 NOTE — Patient Instructions (Signed)

## 2015-01-10 NOTE — Progress Notes (Signed)
VASCULAR LAB PRELIMINARY  PRELIMINARY  PRELIMINARY  PRELIMINARY  Left lower extremity venous Doppler completed.    Preliminary report:  There is acute DVT noted in the left soleal vein and superficial thrombosis noted in the left lesser saphenous vein.  Cassidy Tabet, RVT 01/10/2015, 12:15 PM

## 2015-05-14 ENCOUNTER — Ambulatory Visit (INDEPENDENT_AMBULATORY_CARE_PROVIDER_SITE_OTHER): Payer: 59

## 2015-05-14 ENCOUNTER — Ambulatory Visit (INDEPENDENT_AMBULATORY_CARE_PROVIDER_SITE_OTHER): Payer: 59 | Admitting: Family Medicine

## 2015-05-14 VITALS — BP 142/94 | HR 80 | Temp 98.7°F | Resp 17 | Ht 67.5 in | Wt 322.0 lb

## 2015-05-14 DIAGNOSIS — Z86718 Personal history of other venous thrombosis and embolism: Secondary | ICD-10-CM | POA: Diagnosis not present

## 2015-05-14 DIAGNOSIS — R5381 Other malaise: Secondary | ICD-10-CM

## 2015-05-14 DIAGNOSIS — R05 Cough: Secondary | ICD-10-CM | POA: Diagnosis not present

## 2015-05-14 DIAGNOSIS — H109 Unspecified conjunctivitis: Secondary | ICD-10-CM

## 2015-05-14 DIAGNOSIS — R059 Cough, unspecified: Secondary | ICD-10-CM

## 2015-05-14 DIAGNOSIS — J029 Acute pharyngitis, unspecified: Secondary | ICD-10-CM | POA: Diagnosis not present

## 2015-05-14 DIAGNOSIS — R509 Fever, unspecified: Secondary | ICD-10-CM

## 2015-05-14 LAB — POCT RAPID STREP A (OFFICE): Rapid Strep A Screen: NEGATIVE

## 2015-05-14 MED ORDER — POLYMYXIN B-TRIMETHOPRIM 10000-0.1 UNIT/ML-% OP SOLN: 1.0000 [drp] | OPHTHALMIC | Status: DC

## 2015-05-14 MED ORDER — ALBUTEROL SULFATE HFA 108 (90 BASE) MCG/ACT IN AERS: 2.0000 | INHALATION_SPRAY | Freq: Four times a day (QID) | RESPIRATORY_TRACT | Status: DC | PRN

## 2015-05-14 MED ORDER — CEFDINIR 300 MG PO CAPS: 300.0000 mg | ORAL_CAPSULE | Freq: Two times a day (BID) | ORAL | Status: DC

## 2015-05-14 NOTE — Progress Notes (Signed)
[facility] [address] [place] [address] [phone]  Date:  05/14/2015   Name:  Richard Leon   DOB:  September 07, 1961   MRN:  [id number]  PCP:  Dorrene German, MD    Chief Complaint: Fever; Cough; Sore Throat; Diarrhea; Generalized Body Aches; Fatigue; and Shortness of Breath   History of Present Illness:  Richard Leon is a 54 y.o. very pleasant male patient who presents with the following:  Here today with illness.  He was treated with xarelto for a DVT this spring.  This was actually the 2nd DVT that he has had, and his mother also has a history of same.  He does not feel like he has a DVT now- his leg feels ok  Today is Thursday. On Saturday he noted onset of ST, body aches, low grade fever, felt tired and chilled, had cough  He also had onset of diarrhea. These sx have persisted but diarrhea has resolved.  He feels like his eyes are sticky in the am.  No eye pain or stickiness No vomiting He had noted some SOB- he has been resting mostly, when he gets up to shower, etc he feels tired   He does not know measurement of his exact temperature.   Has used some OTC sinus medication but no antipyretics today so far  He does not wear contacts   Patient Active Problem List   Diagnosis Date Noted  . Abnormal CXR 02/13/2014  . Dizziness 02/13/2014  . HTN (hypertension) 10/04/2013  . Obesity, unspecified 10/04/2013    Past Medical History  Diagnosis Date  . Hypertension     Past Surgical History  Procedure Laterality Date  . None      Social History  Substance Use Topics  . Smoking status: Never Smoker   . Smokeless tobacco: None  . Alcohol Use: No    Family History  Problem Relation Age of Onset  . Arthritis Mother   . Hypertension Mother   . COPD Mother   . Kidney disease Sister   . Cancer Brother     No Known Allergies  Medication list has been reviewed and updated.  Current Outpatient Prescriptions on File Prior to  Visit  Medication Sig Dispense Refill  . Amlodipine-Valsartan-HCTZ (EXFORGE HCT) 10-160-25 MG TABS Take 1 tablet by mouth daily. 30 tablet 12  . aspirin EC 81 MG tablet Take 81 mg by mouth daily.    Marland Kitchen glucosamine-chondroitin 500-400 MG tablet Take 1 tablet by mouth once.    Marland Kitchen ibuprofen (ADVIL,MOTRIN) 200 MG tablet Take 200 mg by mouth 1 day or 1 dose.    . meloxicam (MOBIC) 15 MG tablet Take 15 mg by mouth daily.    . Rivaroxaban (XARELTO STARTER PACK) 15 & 20 MG TBPK Take as directed on package: Start with one 15mg  tablet by mouth twice a day with food. On Day 22, switch to one 20mg  tablet once a day with food. (Patient not taking: Reported on 05/14/2015) 51 each 0  . rivaroxaban (XARELTO) 20 MG TABS tablet Take 1 tablet (20 mg total) by mouth daily with supper. (Patient not taking: Reported on 05/14/2015) 30 tablet 4   Current Facility-Administered Medications on File Prior to Visit  Medication Dose Route Frequency Provider Last Rate Last Dose  . acetaminophen (TYLENOL) tablet 975 mg  975 mg Oral Once Pearline Cables, MD        Review of Systems:   As per HPI- otherwise  negative.   Physical Examination: Filed Vitals:   05/14/15 0842  BP: 142/94  Pulse: 80  Temp: 98.7 F (37.1 C)  Resp: 17   Filed Vitals:   05/14/15 0842  Height: 5' 7.5" (1.715 m)  Weight: 322 lb (146.058 kg)   Body mass index is 49.66 kg/(m^2). Ideal Body Weight: Weight in (lb) to have BMI = 25: 161.7  GEN: WDWN, NAD, Non-toxic, A & O x 3, obese, looks well HEENT: Atraumatic, Normocephalic. Neck supple. No masses, No LAD.  Bilateral TM wnl, oropharynx normal.  PEERL,EOMI.  Small amount of sticky matter in both eyes.  Slight injection of conjunctivae bilaterally  Ears and Nose: No external deformity. CV: RRR, No M/G/R. No JVD. No thrill. No extra heart sounds. PULM: CTA B, no wheezes, crackles, rhonchi. No retractions. No resp. distress. No accessory muscle use. ABD: S, NT, ND EXTR: No c/c/e NEURO Normal  gait.  PSYCH: Normally interactive. Conversant. Not depressed or anxious appearing.  Calm demeanor.   [facility] reading (PRIMARY) by  Dr. Patsy Lager. CXR:  Poor inspiration/ obesity.  Possible RLL pneumonia  CHEST 2 VIEW  COMPARISON: 10/04/2013.  FINDINGS: The cardiac silhouette, mediastinal and hilar contours are stable. There is mild tortuosity of the thoracic aorta. Low lung volumes with vascular crowding and streaky basilar atelectasis. No definite infiltrates or effusions. The bony thorax is intact.  IMPRESSION: Low lung volumes with vascular crowding and bibasilar atelectasis. No definite infiltrates or effusions.  Results for orders placed or performed in visit on 05/14/15  POCT rapid strep A  Result Value Ref Range   Rapid Strep A Screen Negative Negative    Assessment and Plan: Cough - Plan: DG Chest 2 View, cefdinir (OMNICEF) 300 MG capsule, albuterol (PROVENTIL HFA;VENTOLIN HFA) 108 (90 BASE) MCG/ACT inhaler  History of DVT (deep vein thrombosis) - Plan: Ambulatory referral to Hematology  Malaise - Plan: DG Chest 2 View  Sore throat - Plan: POCT rapid strep A  Fever, unspecified fever cause - Plan: POCT rapid strep A, DG Chest 2 View  Bilateral conjunctivitis - Plan: trimethoprim-polymyxin b (POLYTRIM) ophthalmic solution  Here today with illness, cough, subjective fever.  CXR is not entirely clear Will start on omnicef for possible bronchitis or early CAP.   Also treat for conjunctivitis with polytrim Referral to hematology to get their opinion regarding   Signed Abbe Amsterdam, MD

## 2015-05-14 NOTE — Patient Instructions (Signed)
Good to see you today- I hope that you feel better soon Use the antibiotic (omnicef) as directed and the eye drops every 4-6 hours while awake for 5-7 days Try the albuterol as needed for cough and shortness of breath Let me know if you do not feel better in the next few days-  Sooner if worse.   I will set you up to see hematology regarding your blood clots-  I think you might be safer on long term blood thinners to prevent recurrent clots

## 2015-05-22 ENCOUNTER — Telehealth: Payer: Self-pay | Admitting: Hematology & Oncology

## 2015-05-22 NOTE — Telephone Encounter (Signed)
Mailed out new patient letter and calendar °

## 2015-06-19 ENCOUNTER — Ambulatory Visit: Payer: 59

## 2015-06-19 ENCOUNTER — Ambulatory Visit (HOSPITAL_BASED_OUTPATIENT_CLINIC_OR_DEPARTMENT_OTHER): Payer: 59

## 2015-06-19 ENCOUNTER — Encounter: Payer: Self-pay | Admitting: Hematology & Oncology

## 2015-06-19 ENCOUNTER — Ambulatory Visit (HOSPITAL_BASED_OUTPATIENT_CLINIC_OR_DEPARTMENT_OTHER): Payer: 59 | Admitting: Hematology & Oncology

## 2015-06-19 VITALS — BP 175/98 | HR 69 | Temp 97.8°F | Resp 16 | Ht 67.0 in | Wt 328.0 lb

## 2015-06-19 DIAGNOSIS — Z86718 Personal history of other venous thrombosis and embolism: Secondary | ICD-10-CM

## 2015-06-19 DIAGNOSIS — I82402 Acute embolism and thrombosis of unspecified deep veins of left lower extremity: Secondary | ICD-10-CM

## 2015-06-19 LAB — COMPREHENSIVE METABOLIC PANEL (CC13)
ALBUMIN: 3.5 g/dL (ref 3.5–5.0)
ALK PHOS: 85 U/L (ref 40–150)
ALT: 13 U/L (ref 0–55)
AST: 17 U/L (ref 5–34)
Anion Gap: 6 mEq/L (ref 3–11)
BUN: 10.2 mg/dL (ref 7.0–26.0)
CALCIUM: 9.5 mg/dL (ref 8.4–10.4)
CO2: 28 mEq/L (ref 22–29)
Chloride: 107 mEq/L (ref 98–109)
Creatinine: 1 mg/dL (ref 0.7–1.3)
GLUCOSE: 79 mg/dL (ref 70–140)
POTASSIUM: 4.3 meq/L (ref 3.5–5.1)
SODIUM: 141 meq/L (ref 136–145)
Total Bilirubin: 0.79 mg/dL (ref 0.20–1.20)
Total Protein: 7.4 g/dL (ref 6.4–8.3)

## 2015-06-19 LAB — CBC WITH DIFFERENTIAL (CANCER CENTER ONLY)
BASO#: 0 10*3/uL (ref 0.0–0.2)
BASO%: 0.3 % (ref 0.0–2.0)
EOS ABS: 0.1 10*3/uL (ref 0.0–0.5)
EOS%: 2.1 % (ref 0.0–7.0)
HCT: 43.8 % (ref 38.7–49.9)
HEMOGLOBIN: 14.6 g/dL (ref 13.0–17.1)
LYMPH#: 1.9 10*3/uL (ref 0.9–3.3)
LYMPH%: 28.1 % (ref 14.0–48.0)
MCH: 28.6 pg (ref 28.0–33.4)
MCHC: 33.3 g/dL (ref 32.0–35.9)
MCV: 86 fL (ref 82–98)
MONO#: 0.6 10*3/uL (ref 0.1–0.9)
MONO%: 8.9 % (ref 0.0–13.0)
NEUT%: 60.6 % (ref 40.0–80.0)
NEUTROS ABS: 4.1 10*3/uL (ref 1.5–6.5)
Platelets: 183 10*3/uL (ref 145–400)
RBC: 5.1 10*6/uL (ref 4.20–5.70)
RDW: 14.6 % (ref 11.1–15.7)
WBC: 6.7 10*3/uL (ref 4.0–10.0)

## 2015-06-19 MED ORDER — RIVAROXABAN 20 MG PO TABS: 20.0000 mg | ORAL_TABLET | Freq: Every day | ORAL | Status: DC

## 2015-06-19 NOTE — Progress Notes (Signed)
Referral MD  Reason for Referral: Recurrent thrombo-embolic disease of the left leg   Chief Complaint  Patient presents with  . OTHER  : I have another blood clot in my left leg.  HPI: Richard Leon is a very nice 54 year old African-American male. He has a history of hypertension.  He apparently had his first thrombus in the left leg back in 2014. A Doppler that was done at that time showed a thrombus in the left popliteal, profunda femoris and common femoral veins. At that time, he denies any kind of long distance travel. There is no immobility. There is no trauma. He does not smoke.  He says he was on Xarelto for about 2 months.  He does state that his mother had blood clots. There is no other evidence of thromboembolic disease in the family.  Most recently, he developed pain and swelling in the left leg. Again, this was without provocation. This was in April. He had another Doppler done which showed an acute thrombus in the soleal vein of the left leg and a thrombus in the left lesser saphenous vein.  He was put back on Xarelto. Again he said he is on for about 2 months.  He has a swollen left leg. This is been chronic.  Today, no hypercoagulable studies have been done.  He is kindly referred to the [facility] for an evaluation.  He is still working. He's had no problems with cough or shortness of breath. He's had no bleeding. He has had a colonoscopy.  He has a daughter and 2 sons. They are both healthy.  He has had no change in medications.  Overall, his performance status is ECOG 0.                      Past Medical History  Diagnosis Date  . Hypertension   :  Past Surgical History  Procedure Laterality Date  . None    :   Current outpatient prescriptions:  .  Amlodipine-Valsartan-HCTZ (EXFORGE HCT) 10-160-25 MG TABS, Take 1 tablet by mouth daily., Disp: 30 tablet, Rfl: 12 .  ibuprofen (ADVIL,MOTRIN) 200 MG tablet, Take 200 mg  by mouth 1 day or 1 dose., Disp: , Rfl:  .  rivaroxaban (XARELTO) 20 MG TABS tablet, Take 1 tablet (20 mg total) by mouth daily with supper., Disp: 30 tablet, Rfl: 12  Current facility-administered medications:  .  acetaminophen (TYLENOL) tablet 975 mg, 975 mg, Oral, Once, Gwenlyn Found Copland, MD:   :  No Known Allergies:  Family History  Problem Relation Age of Onset  . Arthritis Mother   . Hypertension Mother   . COPD Mother   . Kidney disease Sister   . Cancer Brother   :  Social History   Social History  . Marital Status: Married    Spouse Name: N/A  . Number of Children: 3  . Years of Education: N/A   Occupational History  .  Lab [employer]   Social History Main Topics  . Smoking status: Never Smoker   . Smokeless tobacco: Not on file  . Alcohol Use: No  . Drug Use: No  . Sexual Activity:    Partners: Female     Comment: Wife is the only sexual partner   Other Topics Concern  . Not on file   Social History Narrative   Lives with wife.    :  Pertinent items are noted in HPI.  Exam: @  obese  African [place] male in no obvious distress. Vital signs are temperature of 97.8. Pulse 69. Blood pressure 135/98. Weight is 328 pounds. Head and neck exam shows no ocular or oral lesions. There are no palpable cervical or supraclavicular lymph nodes. Lungs are clear to percussion and ask rotation bilaterally. Cardiac exam regular rate and rhythm with no murmurs, rubs or bruits. Abdomen is obese. It is soft. Has good bowel sounds. There is no fluid wave. There is no palpable liver or spleen tip. Back exam shows no tenderness over the spine, ribs or hips. Extremities shows chronic nonpitting edema of the left leg. No obvious venous cord is noted in the left leg. He has a negative Homans sign in the left leg. Right leg is unremarkable. Skin exam shows no rashes, ecchymoses or petechia. Neurological exam shows no focal neurological deficits.    Recent Labs  06/19/15 1318   WBC 6.7  HGB 14.6  HCT 43.8  PLT 183    Recent Labs  06/19/15 1318  NA 141  K 4.3  CO2 28  GLUCOSE 79  BUN 10.2  CREATININE 1.0  CALCIUM 9.5    Blood smear review:  None  Pathology: None     Assessment and Plan:  Mr. Richard Leon is a 54 year old African-American male. He has a recurrence of his thromboembolic disease. This appears to be in a different vein then what he had back in 2014.  I clearly think that he is going to need to be on long-term anticoagulation. The fact that he has had a second thrombus in a matter of 2 years is quite worrisome. He is quite large. Thankfully, he does not smoke. He does not have diabetes.  I think that given the swelling of his left leg, he is at risk for post phlebitic syndrome area and as such, I think he needs a compression stocking for the left leg. I wrote him a prescription for a thigh-high compression stocking which hopefully will help. Her graft I think he needs to get back on to Xarelto. I just feel more confident with him on Xarelto. I think he probably needs Xarelto for at least 1 year.  We did get thrombophilic studies on him. I would think it would be unusual if he tested positive for a thrombophilic condition.  He is a very nice. It was very nice talking with him.  I does think that off anticoagulation right now, he is clearly at risk for recurrence and possibly even pulmonary embolism.  I probably would repeat a Doppler of his left leg in about 6 months.  Like to see him back in 3 months.  I spent about 45 minutes talking with him. I answered all of his questions.  Again, he is a nice guy and hopefully, we can help him and try to make his life a little bit better.

## 2015-06-25 LAB — HYPERCOAGULABLE PANEL, COMPREHENSIVE
ANTICARDIOLIPIN IGM: 13 [MPL'U]/mL — AB (ref <11)
AntiThromb III Func: 82 % activity (ref 80–120)
Anticardiolipin IgA: 11 APL U/mL (ref <22)
Anticardiolipin IgG: 4 GPL U/mL (ref <23)
BETA-2-GLYCOPROTEIN I IGA: 7 A Units (ref <20)
Beta-2 Glyco I IgG: 3 G Units (ref <20)
Beta-2-Glycoprotein I IgM: 3 M Units (ref <20)
PROTEIN S ACTIVITY: 91 % (ref 70–150)
PROTEIN S ANTIGEN, TOTAL: 125 % (ref 70–140)
Protein C Activity: 80 % (ref 70–180)
Protein C Antigen: 88 % (ref 70–140)

## 2015-06-25 LAB — RFX PTT-LA W/RFX TO HEX PHASE CONF: PTT-LA Screen: 29 s (ref <=40)

## 2015-06-25 LAB — RFX DRVVT SCR W/RFLX CONF 1:1 MIX: dRVVT Screen: 34 s (ref <=45)

## 2015-06-26 ENCOUNTER — Telehealth: Payer: Self-pay | Admitting: Nurse Practitioner

## 2015-06-26 NOTE — Telephone Encounter (Addendum)
LVM on patient's personal machine and instructed him to contact office with any further questions or concerns.   ----- Message from Josph Macho, MD sent at 06/25/2015  5:40 PM EDT ----- Call - the clotting studies are pretty much normal!!!  Stay on Xarelto!!! pete

## 2015-06-29 LAB — D-DIMER, QUANTITATIVE: D-Dimer, Quant: 0.98 ug/mL-FEU — ABNORMAL HIGH (ref 0.00–0.48)

## 2015-09-28 ENCOUNTER — Ambulatory Visit: Payer: 59 | Admitting: Family

## 2015-09-28 ENCOUNTER — Other Ambulatory Visit: Payer: 59

## 2015-11-17 ENCOUNTER — Ambulatory Visit (INDEPENDENT_AMBULATORY_CARE_PROVIDER_SITE_OTHER): Payer: 59 | Admitting: Physician Assistant

## 2015-11-17 VITALS — BP 145/89 | HR 87 | Temp 101.7°F | Resp 17 | Ht 67.5 in | Wt 319.0 lb

## 2015-11-17 DIAGNOSIS — R6889 Other general symptoms and signs: Secondary | ICD-10-CM | POA: Diagnosis not present

## 2015-11-17 MED ORDER — HYDROCOD POLST-CPM POLST ER 10-8 MG/5ML PO SUER: 5.0000 mL | Freq: Two times a day (BID) | ORAL | Status: DC | PRN

## 2015-11-17 MED ORDER — GUAIFENESIN ER 1200 MG PO TB12: 1.0000 | ORAL_TABLET | Freq: Two times a day (BID) | ORAL | Status: AC

## 2015-11-17 NOTE — Patient Instructions (Signed)
Please push fluids.  Tylenol and Motrin for fever and body aches.    A humidifier can help especially when the air is dry -if you do not have a humidifier you can boil a pot of water on the stove in your home to help with the dry air.  

## 2015-11-17 NOTE — Progress Notes (Signed)
   Richard Leon  MRN: [id number] DOB: [birth year]  Subjective:  Pt presents to with 4 day h/o cold symptoms that seem to be getting worse.  He has myalgias and dry cough that is keeping him up at night.  He has nasal congestion with clear rhinorrhea that rarely has blood in it.  Home treatment - cold preps Sick contacts - general public Flu vaccine not this year  Patient Active Problem List   Diagnosis Date Noted  . History of DVT (deep vein thrombosis) 05/14/2015  . Abnormal CXR 02/13/2014  . Dizziness 02/13/2014  . HTN (hypertension) 10/04/2013  . Obesity, unspecified 10/04/2013    Current Outpatient Prescriptions on File Prior to Visit  Medication Sig Dispense Refill  . Amlodipine-Valsartan-HCTZ (EXFORGE HCT) 10-160-25 MG TABS Take 1 tablet by mouth daily. 30 tablet 12  . ibuprofen (ADVIL,MOTRIN) 200 MG tablet Take 200 mg by mouth 1 day or 1 dose.    . rivaroxaban (XARELTO) 20 MG TABS tablet Take 1 tablet (20 mg total) by mouth daily with supper. 30 tablet 12   No current facility-administered medications on file prior to visit.    No Known Allergies  Review of Systems  Constitutional: Positive for fever (subjective) and chills.  HENT: Positive for congestion, postnasal drip, rhinorrhea (clear with blood tinged at times) and sneezing. Negative for sore throat.   Respiratory: Positive for cough (dry).        No h/o asthma, nonsmoker  Gastrointestinal: Positive for nausea. Negative for vomiting and diarrhea.  Musculoskeletal: Positive for myalgias.  Neurological: Positive for headaches.  Psychiatric/Behavioral: Positive for sleep disturbance (2nd to cough).   Objective:  BP 145/89 mmHg  Pulse 87  Temp(Src) 101.7 F (38.7 C) (Oral)  Resp 17  Ht 5' 7.5" (1.715 m)  Wt 319 lb (144.697 kg)  BMI 49.20 kg/m2  SpO2 93%  Physical Exam  Constitutional: He is oriented to person, place, and time and well-developed, well-nourished, and in no distress.  Pt appears to not  feel well.  HENT:  Head: Normocephalic and atraumatic.  Right Ear: Hearing, tympanic membrane, external ear and ear canal normal.  Left Ear: Hearing, tympanic membrane, external ear and ear canal normal.  Nose: Nose normal.  Mouth/Throat: Uvula is midline, oropharynx is clear and moist and mucous membranes are normal.  Eyes: Conjunctivae are normal.  Neck: Normal range of motion.  Cardiovascular: Normal rate, regular rhythm and normal heart sounds.   Pulmonary/Chest: Effort normal and breath sounds normal. He has no wheezes.  Lymphadenopathy:       Head (right side): No tonsillar adenopathy present.       Head (left side): No tonsillar adenopathy present.    He has no cervical adenopathy.       Right: No supraclavicular adenopathy present.       Left: No supraclavicular adenopathy present.  Neurological: He is alert and oriented to person, place, and time. Gait normal.  Skin: Skin is warm and dry.  Psychiatric: Mood, memory, affect and judgment normal.    Assessment and Plan :  Flu-like symptoms - Plan: Guaifenesin (MUCINEX MAXIMUM STRENGTH) 1200 MG TB12, chlorpheniramine-HYDROcodone (TUSSIONEX PENNKINETIC ER) 10-8 MG/5ML SUER, Care order/instruction   Symptomatic treatment d/w pt.  Benny Lennert PA-C  [facility] 11/17/2015 10:12 AM

## 2016-02-02 IMAGING — CR DG KNEE 1-2V*R*
2 series · 2 of 2 positions shown · non-contrast
Comparison: None.

CLINICAL DATA: Right knee pain

EXAM:
RIGHT KNEE - 1-2 VIEW

[t knee ap right]
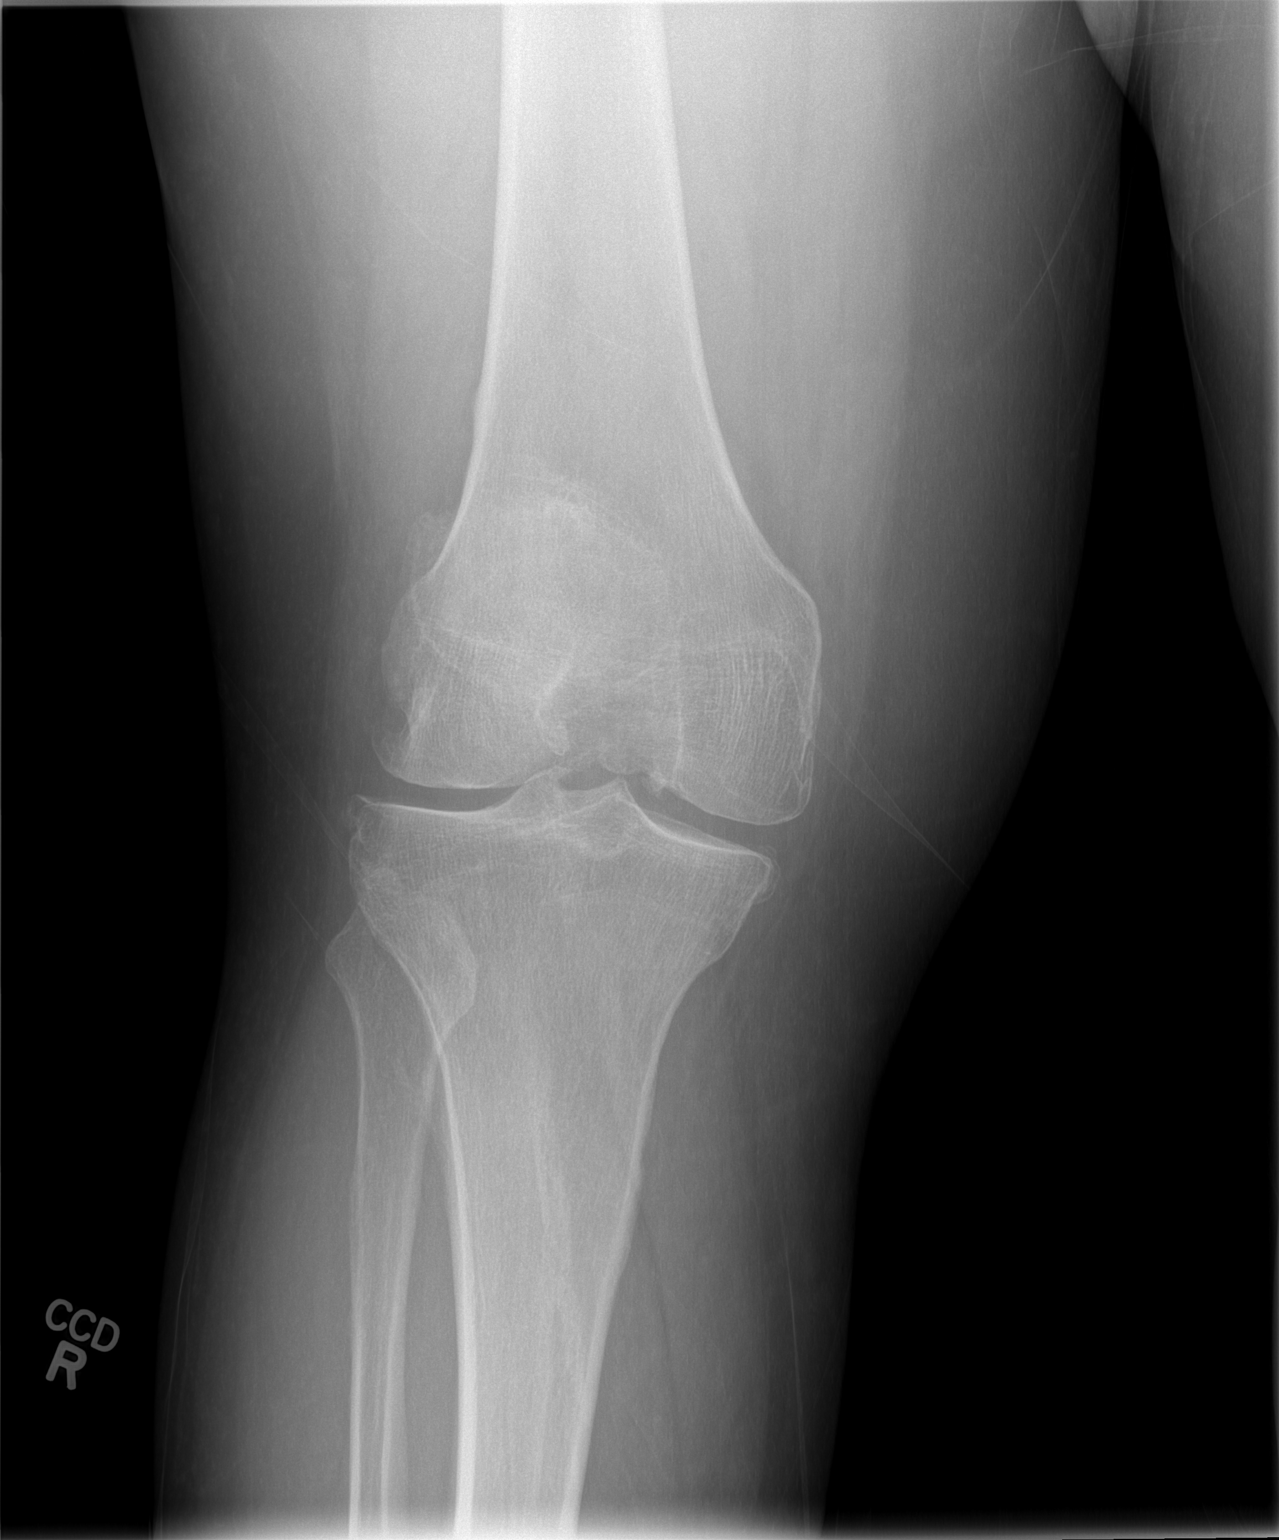

[t knee lat right]
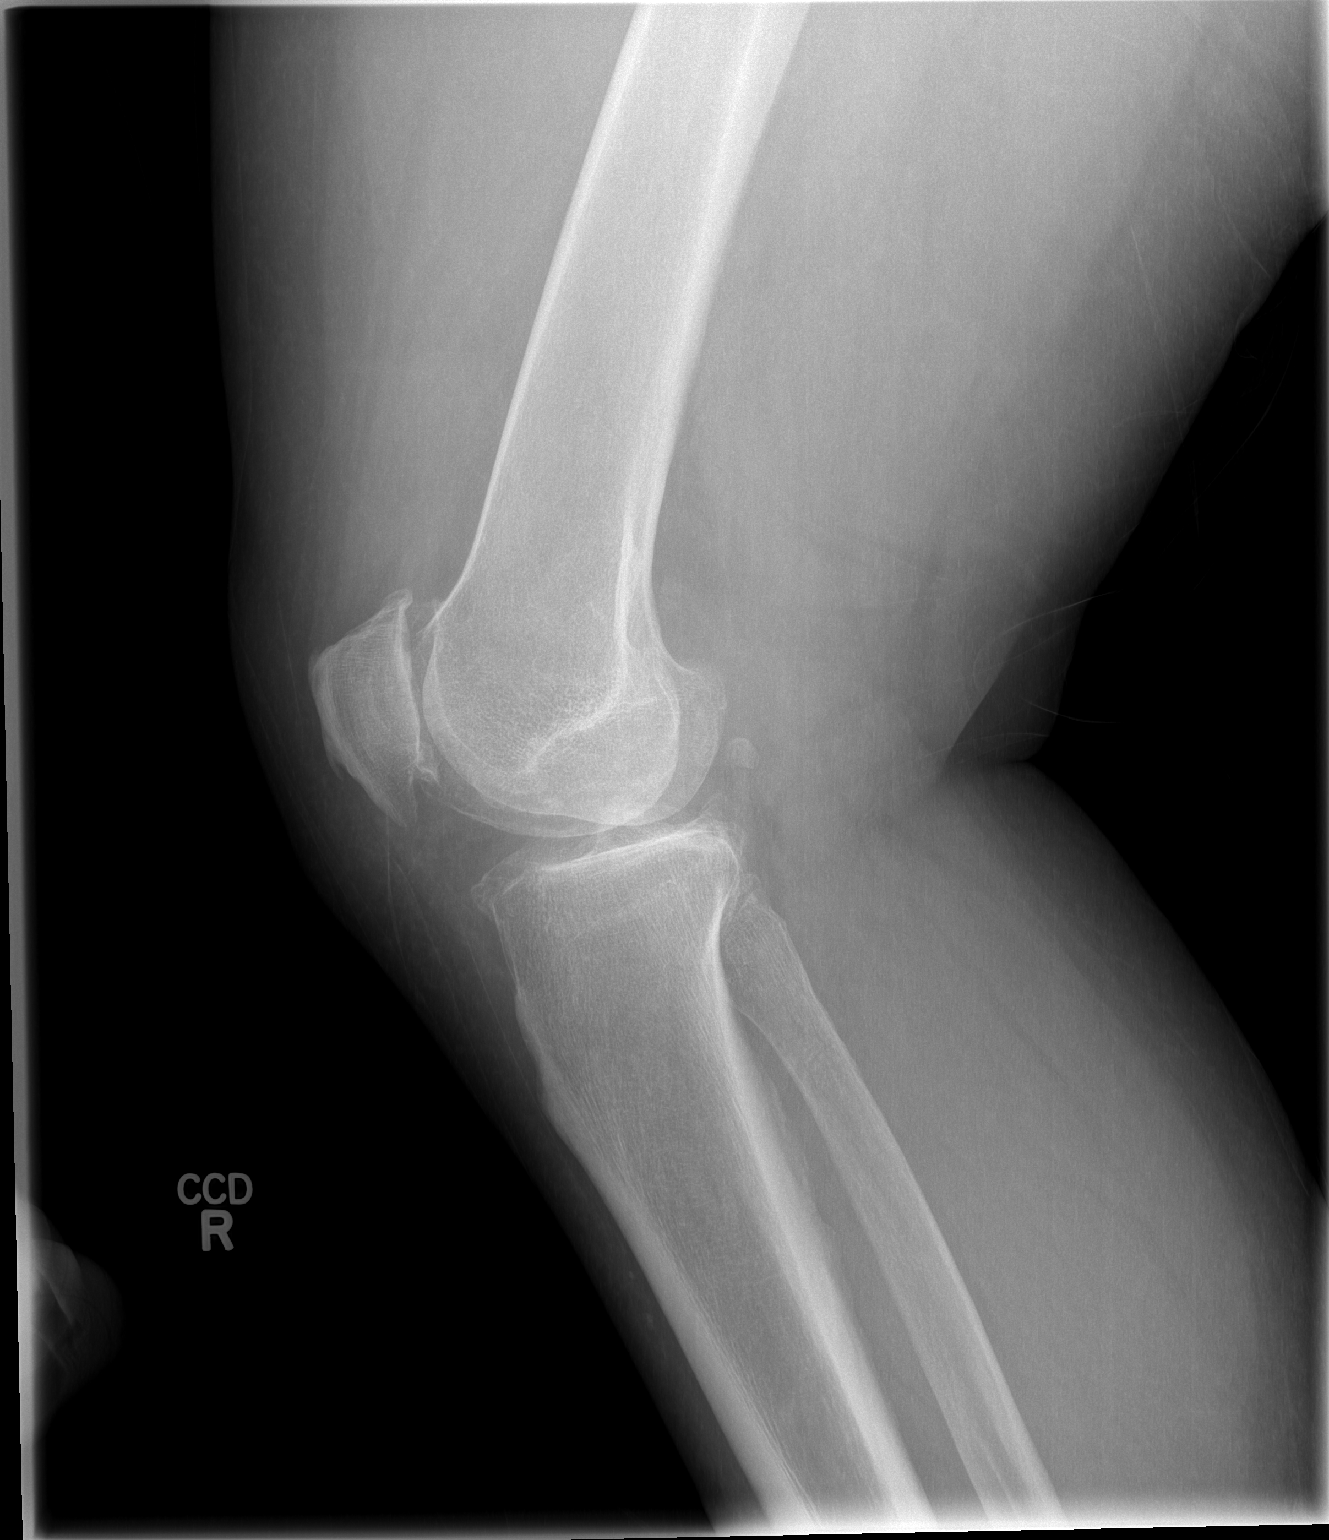

[2 of 2 positions shown; findings below may reference images not displayed]

FINDINGS: No fracture. No dislocation. There is mild lateral compartment and
more significant patellofemoral joint space compartment narrowing
marginal osteophytes are noted from all 3 compartments. Bones are
demineralized. There is a small joint effusion. Soft tissues are
unremarkable.
IMPRESSION: Degenerative changes as described most prominently affecting the
lateral and patellofemoral joint space compartments. Small joint
effusion.

## 2016-03-07 ENCOUNTER — Emergency Department (HOSPITAL_COMMUNITY)
Admission: EM | Admit: 2016-03-07 | Discharge: 2016-03-08 | Disposition: A | Discharge status: Home / Self Care | Payer: 59 | Attending: Emergency Medicine | Admitting: Emergency Medicine

## 2016-03-07 ENCOUNTER — Emergency Department (HOSPITAL_COMMUNITY): Payer: 59

## 2016-03-07 ENCOUNTER — Encounter (HOSPITAL_COMMUNITY): Payer: Self-pay

## 2016-03-07 DIAGNOSIS — Z79899 Other long term (current) drug therapy: Secondary | ICD-10-CM | POA: Insufficient documentation

## 2016-03-07 DIAGNOSIS — Z7901 Long term (current) use of anticoagulants: Secondary | ICD-10-CM | POA: Diagnosis not present

## 2016-03-07 DIAGNOSIS — R519 Headache, unspecified: Secondary | ICD-10-CM

## 2016-03-07 DIAGNOSIS — M7989 Other specified soft tissue disorders: Secondary | ICD-10-CM | POA: Diagnosis not present

## 2016-03-07 DIAGNOSIS — R51 Headache: Secondary | ICD-10-CM | POA: Diagnosis not present

## 2016-03-07 DIAGNOSIS — I10 Essential (primary) hypertension: Secondary | ICD-10-CM | POA: Insufficient documentation

## 2016-03-07 LAB — CBC WITH DIFFERENTIAL/PLATELET
Basophils Absolute: 0 K/uL (ref 0.0–0.1)
Basophils Relative: 0 %
Eosinophils Absolute: 0.2 K/uL (ref 0.0–0.7)
Eosinophils Relative: 2 %
HCT: 40.9 % (ref 39.0–52.0)
Hemoglobin: 13.2 g/dL (ref 13.0–17.0)
Lymphocytes Relative: 34 %
Lymphs Abs: 2.6 K/uL (ref 0.7–4.0)
MCH: 28.6 pg (ref 26.0–34.0)
MCHC: 32.3 g/dL (ref 30.0–36.0)
MCV: 88.5 fL (ref 78.0–100.0)
Monocytes Absolute: 0.6 K/uL (ref 0.1–1.0)
Monocytes Relative: 8 %
Neutro Abs: 4.2 K/uL (ref 1.7–7.7)
Neutrophils Relative %: 56 %
Platelets: 176 K/uL (ref 150–400)
RBC: 4.62 MIL/uL (ref 4.22–5.81)
RDW: 15.1 % (ref 11.5–15.5)
WBC: 7.7 K/uL (ref 4.0–10.5)

## 2016-03-07 NOTE — ED Notes (Signed)
Patient does not endorse pain to the lower left leg.  States only pain is that of a headache that he has had "for a while" per patient

## 2016-03-07 NOTE — ED Notes (Signed)
Pt compaining of L leg pain and swelling with "red spots." Pt states hx: DVT. States ongoing since 02/27/16. Pt with obvious swelling to L leg, no obvious spotting. Pt also states hx: htn, not taking meds d/t "I havn't ordered them yet."

## 2016-03-07 NOTE — ED Provider Notes (Signed)
CSN: [id number]     Arrival date & time 03/07/16  1914 History  By signing my name below, I, Emmanuella Mensah, attest that this documentation has been prepared under the direction and in the presence of Gilda Crease, MD. Electronically Signed: Angelene Giovanni, ED Scribe. 03/07/2016. 11:28 PM.     Chief Complaint  Patient presents with  . Leg Swelling   The history is provided by the patient. No language interpreter was used.   HPI Comments: Richard Leon is a 55 y.o. male with a hx of HTN who presents to the Emergency Department complaining of gradually worsening left leg swelling with generalized red "spots" onset 02/27/16. He reports associated tingling sensation to the area. He adds that he has an ongoing generalized headache onset 4 days ago. No alleviating factors noted. Pt has not tried any medications PTA. Pt reports a hx of DVT. He states that although he has been prescribed Xarelto, he only takes it during a DVT flare up and stops taking it per advise of his Hematologist. He denies any fever, chest pain, SOB, or n/v.    Past Medical History  Diagnosis Date  . Hypertension    Past Surgical History  Procedure Laterality Date  . None     Family History  Problem Relation Age of Onset  . Arthritis Mother   . Hypertension Mother   . COPD Mother   . Kidney disease Sister   . Cancer Brother    Social History  Substance Use Topics  . Smoking status: Never Smoker   . Smokeless tobacco: None  . Alcohol Use: No    Review of Systems  Constitutional: Negative for fever.  Respiratory: Negative for shortness of breath.   Cardiovascular: Positive for leg swelling. Negative for chest pain.  Gastrointestinal: Negative for nausea and vomiting.  Neurological: Positive for headaches.  All other systems reviewed and are negative.     Allergies  Review of patient's allergies indicates no known allergies.  Home Medications   Prior to Admission medications    Medication Sig Start Date End Date Taking? Authorizing Provider  Amlodipine-Valsartan-HCTZ (EXFORGE HCT) 10-160-25 MG TABS Take 1 tablet by mouth daily. 01/10/15  Yes Carmelina Dane, MD  chlorpheniramine-HYDROcodone [facility] PENNKINETIC ER) 10-8 MG/5ML SUER Take 5 mLs by mouth 2 (two) times daily as needed for cough. Patient not taking: Reported on 03/07/2016 11/17/15   Morrell Riddle, PA-C  rivaroxaban (XARELTO) 20 MG TABS tablet Take 1 tablet (20 mg total) by mouth daily with supper. Patient not taking: Reported on 03/07/2016 06/19/15   Josph Macho, MD  Rivaroxaban 15 & 20 MG TBPK Take 15 mg by mouth 2 (two) times daily. Take as directed on package: Start with one 15mg  tablet by mouth twice a day with food. On Day 22, switch to one 20mg  tablet once a day with food. 03/08/16   Gilda Crease, MD   BP 129/77 mmHg  Pulse 62  Temp(Src) 97.9 F (36.6 C) (Oral)  Resp 20  Ht 5\' 7"  (1.702 m)  Wt 315 lb 5 oz (143.025 kg)  BMI 49.37 kg/m2  SpO2 93% Physical Exam  Constitutional: He is oriented to person, place, and time. He appears well-developed and well-nourished. No distress.  HENT:  Head: Normocephalic and atraumatic.  Right Ear: Hearing normal.  Left Ear: Hearing normal.  Nose: Nose normal.  Mouth/Throat: Oropharynx is clear and moist and mucous membranes are normal.  Eyes: Conjunctivae and EOM are normal. Pupils are equal,  round, and reactive to light.  Neck: Normal range of motion. Neck supple.  Cardiovascular: Regular rhythm, S1 normal and S2 normal.  Exam reveals no gallop and no friction rub.   No murmur heard. Pulmonary/Chest: Effort normal and breath sounds normal. No respiratory distress. He exhibits no tenderness.  Abdominal: Soft. Normal appearance and bowel sounds are normal. There is no hepatosplenomegaly. There is no tenderness. There is no rebound, no guarding, no tenderness at McBurney's point and negative Murphy's sign. No hernia.  Musculoskeletal: Normal range  of motion. He exhibits edema.  non pitting swelling in bilateral legs, worse much more than right  Neurological: He is alert and oriented to person, place, and time. He has normal strength. No cranial nerve deficit or sensory deficit. Coordination normal. GCS eye subscore is 4. GCS verbal subscore is 5. GCS motor subscore is 6.  Skin: Skin is warm, dry and intact. No rash noted. No cyanosis.  Psychiatric: He has a normal mood and affect. His speech is normal and behavior is normal. Thought content normal.  Nursing note and vitals reviewed.   ED Course  Procedures (including critical care time) DIAGNOSTIC STUDIES: Oxygen Saturation is 98% on RA, normal by my interpretation.    COORDINATION OF CARE: 11:26 PM- Pt advised of plan for treatment and pt agrees. Pt will receive lab work with a D-dimer. He will also receive a CT head scan for further evaluation.    Labs Review Labs Reviewed  BASIC METABOLIC PANEL - Abnormal; Notable for the following:    Anion gap 3 (*)    All other components within normal limits  D-DIMER, QUANTITATIVE (NOT [facility]) - Abnormal; Notable for the following:    D-Dimer, Quant 0.96 (*)    All other components within normal limits  CBC WITH DIFFERENTIAL/PLATELET  PROTIME-INR    Imaging Review Ct Head Wo Contrast  03/07/2016  CLINICAL DATA:  Headache since 03/05/2016 No known injury Hx of HTN Pt sts he ran out of blood pressure medication and has not been able to fill due to caring for his mother EXAM: CT HEAD WITHOUT CONTRAST TECHNIQUE: Contiguous axial images were obtained from the base of the skull through the vertex without intravenous contrast. COMPARISON:  None. FINDINGS: Brain: No evidence of acute infarction, hemorrhage, extra-axial collection, ventriculomegaly, or mass effect. Vascular: No hyperdense vessel or unexpected calcification. Skull: Negative for fracture or focal lesion. Sinuses/Orbits: No acute findings. Other: None. IMPRESSION: 1. Negative for  bleed or other acute intracranial process. Electronically Signed   By: Corlis Leak  Hassell M.D.   On: 03/07/2016 23:53     Gilda Creasehristopher J Latrell Reitan, MD has personally reviewed and evaluated these images and lab results as part of his medical decision-making.   EKG Interpretation None      MDM   Final diagnoses:  Leg swelling  Headache, unspecified headache type    Patient presents to the emergency department for evaluation of multiple problems. Patient was here primarily for pain and swelling of the left leg. Patient is concerned about this because he has a history of previous DVT in the leg. Symptoms have been ongoing for approximately 1 week. He did have swelling of both legs, but left was much more swollen than the right. No sign of cellulitis. No sign of vascular compromise. Dimer was elevated, cannot rule out DVT. Will require venous duplex in the morning. Due to the patient having a headache that was unusual for him, CT head was performed, especially anticipating restarting anticoagulation. CT was  unremarkable. Lab work was unremarkable other than d-dimer. Patient was given Xarelto tonight and a prescription that he will fill tomorrow if his venous duplex is positive. Follow-up with PCP.  I personally performed the services described in this documentation, which was scribed in my presence. The recorded information has been reviewed and is accurate.   Gilda Crease, MD 03/08/16 [phone]

## 2016-03-07 NOTE — ED Notes (Signed)
Pt able to ambulate independently.  Gait steady and even.   

## 2016-03-08 ENCOUNTER — Ambulatory Visit (HOSPITAL_BASED_OUTPATIENT_CLINIC_OR_DEPARTMENT_OTHER)
Admission: RE | Admit: 2016-03-08 | Discharge: 2016-03-08 | Disposition: A | Discharge status: Home / Self Care | Payer: 59 | Source: Ambulatory Visit | Attending: Emergency Medicine | Admitting: Emergency Medicine

## 2016-03-08 DIAGNOSIS — M7989 Other specified soft tissue disorders: Secondary | ICD-10-CM

## 2016-03-08 LAB — BASIC METABOLIC PANEL
Anion gap: 3 — ABNORMAL LOW (ref 5–15)
BUN: 12 mg/dL (ref 6–20)
CALCIUM: 9.1 mg/dL (ref 8.9–10.3)
CO2: 27 mmol/L (ref 22–32)
CREATININE: 0.98 mg/dL (ref 0.61–1.24)
Chloride: 108 mmol/L (ref 101–111)
GFR calc Af Amer: >60 mL/min (ref >60)
GLUCOSE: 94 mg/dL (ref 65–99)
Potassium: 3.6 mmol/L (ref 3.5–5.1)
Sodium: 138 mmol/L (ref 135–145)

## 2016-03-08 LAB — PROTIME-INR
INR: 1.08 (ref 0.00–1.49)
Prothrombin Time: 14.2 seconds (ref 11.6–15.2)

## 2016-03-08 LAB — D-DIMER, QUANTITATIVE: D-Dimer, Quant: 0.96 ug/mL-FEU — ABNORMAL HIGH (ref 0.00–0.50)

## 2016-03-08 MED ORDER — RIVAROXABAN (XARELTO) VTE STARTER PACK (15 & 20 MG): 15.0000 mg | ORAL_TABLET | Freq: Two times a day (BID) | ORAL | Status: DC

## 2016-03-08 MED ORDER — RIVAROXABAN 15 MG PO TABS
15.0000 mg | ORAL_TABLET | Freq: Once | ORAL | Status: AC
  Administered 2016-03-08: 15 mg via ORAL
  Filled 2016-03-08: qty 1

## 2016-03-08 NOTE — ED Notes (Signed)
Patient Alert and oriented X4. Stable and ambulatory. Patient verbalized understanding of the discharge instructions.  Patient belongings were taken by the patient. Aware of vascular study tomorrow

## 2016-03-08 NOTE — Progress Notes (Addendum)
Preliminary results by tech - Left Lower Ext. Venous Duplex Completed. No evidence of acute deep vein thrombosis in the left lower ext. There is chronic appearing deep vein thrombosis involving the popliteal vein - HX of DVT in 2015.  The calf proximal veins were technically difficult  to visualize due to patient's body habitus, the mid and distal segments appeared patent. Results given to patient and patient was instructed to follow up with PCP for further evaluation. Marilynne Halstedita Takirah Binford, BS, RDMS, RVT

## 2016-03-08 NOTE — Discharge Instructions (Signed)
Information on my medicine - XARELTO (rivaroxaban)  This medication education was reviewed with me or my healthcare representative as part of my discharge preparation.  The pharmacist that spoke with me during my hospital stay was:  Lavonia Danamend, [place] Berkey George, [facility]  WHY WAS Richard HurlXARELTO PRESCRIBED FOR YOU? Xarelto was prescribed to treat blood clots that may have been found in the veins of your legs (deep vein thrombosis) or in your lungs (pulmonary embolism) and to reduce the risk of them occurring again.  What do you need to know about Xarelto? The starting dose is one 15 mg tablet taken TWICE daily with food for the FIRST 21 DAYS then on July 12  the dose is changed to one 20 mg tablet taken ONCE A DAY with your evening meal.  DO NOT stop taking Xarelto without talking to the health care provider who prescribed the medication.  Refill your prescription for 20 mg tablets before you run out.  After discharge, you should have regular check-up appointments with your healthcare provider that is prescribing your Xarelto.  In the future your dose may need to be changed if your kidney function changes by a significant amount.  What do you do if you miss a dose? If you are taking Xarelto TWICE DAILY and you miss a dose, take it as soon as you remember. You may take two 15 mg tablets (total 30 mg) at the same time then resume your regularly scheduled 15 mg twice daily the next day.  If you are taking Xarelto ONCE DAILY and you miss a dose, take it as soon as you remember on the same day then continue your regularly scheduled once daily regimen the next day. Do not take two doses of Xarelto at the same time.   Important Safety Information Xarelto is a blood thinner medicine that can cause bleeding. You should call your healthcare provider right away if you experience any of the following: ? Bleeding from an injury or your nose that does not stop. ? Unusual colored urine (red or dark brown) or unusual  colored stools (red or black). ? Unusual bruising for unknown reasons. ? A serious fall or if you hit your head (even if there is no bleeding).  Some medicines may interact with Xarelto and might increase your risk of bleeding while on Xarelto. To help avoid this, consult your healthcare provider or pharmacist prior to using any new prescription or non-prescription medications, including herbals, vitamins, non-steroidal anti-inflammatory drugs (NSAIDs) and supplements.  This website has more information on Xarelto: [email or web address].[email or web address].

## 2016-11-08 ENCOUNTER — Ambulatory Visit (INDEPENDENT_AMBULATORY_CARE_PROVIDER_SITE_OTHER): Payer: 59 | Admitting: Urgent Care

## 2016-11-08 ENCOUNTER — Other Ambulatory Visit: Payer: Self-pay | Admitting: Urgent Care

## 2016-11-08 ENCOUNTER — Ambulatory Visit (HOSPITAL_COMMUNITY)
Admission: RE | Admit: 2016-11-08 | Discharge: 2016-11-08 | Disposition: A | Discharge status: Home / Self Care | Payer: 59 | Source: Ambulatory Visit | Attending: Urgent Care | Admitting: Urgent Care

## 2016-11-08 ENCOUNTER — Ambulatory Visit (INDEPENDENT_AMBULATORY_CARE_PROVIDER_SITE_OTHER): Payer: 59

## 2016-11-08 VITALS — BP 144/92 | HR 68 | Temp 97.8°F | Resp 18 | Ht 67.0 in | Wt 334.0 lb

## 2016-11-08 DIAGNOSIS — M7989 Other specified soft tissue disorders: Secondary | ICD-10-CM

## 2016-11-08 DIAGNOSIS — Z86718 Personal history of other venous thrombosis and embolism: Secondary | ICD-10-CM | POA: Diagnosis not present

## 2016-11-08 DIAGNOSIS — R030 Elevated blood-pressure reading, without diagnosis of hypertension: Secondary | ICD-10-CM

## 2016-11-08 DIAGNOSIS — I10 Essential (primary) hypertension: Secondary | ICD-10-CM

## 2016-11-08 DIAGNOSIS — M79662 Pain in left lower leg: Secondary | ICD-10-CM

## 2016-11-08 DIAGNOSIS — R0989 Other specified symptoms and signs involving the circulatory and respiratory systems: Secondary | ICD-10-CM | POA: Diagnosis not present

## 2016-11-08 DIAGNOSIS — I825Z2 Chronic embolism and thrombosis of unspecified deep veins of left distal lower extremity: Secondary | ICD-10-CM | POA: Diagnosis not present

## 2016-11-08 MED ORDER — AMLODIPINE BESYLATE 5 MG PO TABS: 5.0000 mg | ORAL_TABLET | Freq: Every day | ORAL | 0 refills | Status: DC

## 2016-11-08 MED ORDER — HYDROCHLOROTHIAZIDE 12.5 MG PO TABS
12.5000 mg | ORAL_TABLET | Freq: Every day | ORAL | 0 refills | Status: DC
Start: 2016-11-08 — End: 2017-10-20

## 2016-11-08 MED ORDER — RIVAROXABAN 15 MG PO TABS: 15.0000 mg | ORAL_TABLET | Freq: Two times a day (BID) | ORAL | 0 refills | Status: DC

## 2016-11-08 NOTE — Patient Instructions (Addendum)
GO TO [place] [address] [address] TODAY FOR U/S    Deep Vein Thrombosis A deep vein thrombosis (DVT) is a blood clot (thrombus) that usually occurs in a deep, larger vein of the lower leg or the pelvis, or in an upper extremity such as the arm. These are dangerous and can lead to serious and even life-threatening complications if the clot travels to the lungs. A DVT can damage the valves in your leg veins so that instead of flowing upward, the blood pools in the lower leg. This is called post-thrombotic syndrome, and it can result in pain, swelling, discoloration, and sores on the leg. What are the causes? A DVT is caused by the formation of a blood clot in your leg, pelvis, or arm. Usually, several things contribute to the formation of blood clots. A clot may develop when:  Your blood flow slows down.  Your vein becomes damaged in some way.  You have a condition that makes your blood clot more easily. What increases the risk? A DVT is more likely to develop in:  People who are older, especially over 58 years of age.  People who are overweight (obese).  People who sit or lie still for a long time, such as during long-distance travel (over 4 hours), bed rest, hospitalization, or during recovery from certain medical conditions like a stroke.  People who do not engage in much physical activity (sedentary lifestyle).  People who have chronic breathing disorders.  People who have a personal or family history of blood clots or blood clotting disease.  People who have peripheral vascular disease (PVD), diabetes, or some types of cancer.  People who have heart disease, especially if the person had a recent heart attack or has congestive heart failure.  People who have neurological diseases that affect the legs (leg paresis).  People who have had a traumatic injury, such as breaking a hip or leg.  People who have recently had major or lengthy surgery, especially on the hip,  knee, or abdomen.  People who have had a central line placed inside a large vein.  People who take medicines that contain the hormone estrogen. These include birth control pills and hormone replacement therapy.  Pregnancy or during childbirth or the postpartum period.  Long plane flights (over 8 hours). What are the signs or symptoms?   Symptoms of a DVT can include:  Swelling of your leg or arm, especially if one side is much worse.  Warmth and redness of your leg or arm, especially if one side is much worse.  Pain in your arm or leg. If the clot is in your leg, symptoms may be more noticeable or worse when you stand or walk.  A feeling of pins and needles, if the clot is in the arm. The symptoms of a DVT that has traveled to the lungs (pulmonary embolism, PE) usually start suddenly and include:  Shortness of breath while active or at rest.  Coughing or coughing up blood or blood-tinged mucus.  Chest pain that is often worse with deep breaths.  Rapid or irregular heartbeat.  Feeling light-headed or dizzy.  Fainting.  Feeling anxious.  Sweating. There may also be pain and swelling in a leg if that is where the blood clot started. These symptoms may represent a serious problem that is an emergency. Do not wait to see if the symptoms will go away. Get medical help right away. Call your local emergency services (911 in the [place] Do  not drive yourself to the hospital.  How is this diagnosed? Your health care provider will take a medical history and perform a physical exam. You may also have other tests, including:  Blood tests to assess the clotting properties of your blood.  Imaging tests, such as CT, ultrasound, MRI, X-ray, and other tests to see if you have clots anywhere in your body. How is this treated? After a DVT is identified, it can be treated. The type of treatment that you receive depends on many factors, such as the cause of your DVT, your risk for bleeding or  developing more clots, and other medical conditions that you have. Sometimes, a combination of treatments is necessary. Treatment options may be combined and include:  Monitoring the blood clot with ultrasound.  Taking medicines by mouth, such as newer blood thinners (anticoagulants), thrombolytics, or warfarin.  Taking anticoagulant medicine by injection or through an IV tube.  Wearing compression stockings or using different types ofdevices.  Surgery (rare) to remove the blood clot or to place a filter in your abdomen to stop the blood clot from traveling to your lungs. Treatments for a DVT are often divided into immediate treatment and long-term treatment (up to 3 months after DVT). You can work with your health care provider to choose the treatment program that is best for you. Follow these instructions at home: If you are taking a newer oral anticoagulant:  Take the medicine every single day at the same time each day.  Understand what foods and drugs interact with this medicine.  Understand that there are no regular blood tests required when using this medicine.  Understand the side effects of this medicine, including excessive bruising or bleeding. Ask your health care provider or pharmacist about other possible side effects. If you are taking warfarin:  Understand how to take warfarin and know which foods can affect how warfarin works in [employer].  Understand that it is dangerous to take too much or too little warfarin. Too much warfarin increases the risk of bleeding. Too little warfarin continues to allow the risk for blood clots.  Follow your PT and INR blood testing schedule. The PT and INR results allow your health care provider to adjust your dose of warfarin. It is very important that you have your PT and INR tested as often as told by your health care provider.  Avoid major changes in your diet, or tell your health care provider before you change your diet. Arrange a  visit with a registered dietitian to answer your questions. Many foods, especially foods that are high in vitamin K, can interfere with warfarin and affect the PT and INR results. Eat a consistent amount of foods that are high in vitamin K, such as:  Spinach, kale, broccoli, cabbage, collard greens, turnip greens, Brussels sprouts, peas, cauliflower, seaweed, and parsley.  Beef liver and pork liver.  Green tea.  Soybean oil.  Tell your health care provider about any and all medicines, vitamins, and supplements that you take, including aspirin and other over-the-counter anti-inflammatory medicines. Be especially cautious with aspirin and anti-inflammatory medicines. Do not take those before you ask your health care provider if it is safe to do so. This is important because many medicines can interfere with warfarin and affect the PT and INR results.  Do not start or stop taking any over-the-counter or prescription medicine unless your health care provider or pharmacist tells you to do so. If you take warfarin, you will also need to  do these things:  Hold pressure over cuts for longer than usual.  Tell your dentist and other health care providers that you are taking warfarin before you have any procedures in which bleeding may occur.  Avoid alcohol or drink very small amounts. Tell your health care provider if you change your alcohol intake.  Do not use tobacco products, including cigarettes, chewing tobacco, and e-cigarettes. If you need help quitting, ask your health care provider.  Avoid contact sports. General instructions  Take over-the-counter and prescription medicines only as told by your health care provider. Anticoagulant medicines can have side effects, including easy bruising and difficulty stopping bleeding. If you are prescribed an anticoagulant, you will also need to do these things:  Hold pressure over cuts for longer than usual.  Tell your dentist and other health care  providers that you are taking anticoagulants before you have any procedures in which bleeding may occur.  Avoid contact sports.  Wear a medical alert bracelet or carry a medical alert card that says you have had a PE.  Ask your health care provider how soon you can go back to your normal activities. Stay active to prevent new blood clots from forming.  Make sure to exercise while traveling or when you have been sitting or standing for a long period of time. It is very important to exercise. Exercise your legs by walking or by tightening and relaxing your leg muscles often. Take frequent walks.  Wear compression stockings as told by your health care provider to help prevent more blood clots from forming.  Do not use tobacco products, including cigarettes, chewing tobacco, and e-cigarettes. If you need help quitting, ask your health care provider.  Keep all follow-up appointments with your health care provider. This is important. How is this prevented? Take these actions to decrease your risk of developing another DVT:  Exercise regularly. For at least 30 minutes every day, engage in:  Activity that involves moving your arms and legs.  Activity that encourages good blood flow through your body by increasing your heart rate.  Exercise your arms and legs every hour during long-distance travel (over 4 hours). Drink plenty of water and avoid drinking alcohol while traveling.  Avoid sitting or lying in bed for long periods of time without moving your legs.  Maintain a weight that is appropriate for your height. Ask your health care provider what weight is healthy for you.  If you are a woman who is over 66 years of age, avoid unnecessary use of medicines that contain estrogen. These include birth control pills.  Do not smoke, especially if you take estrogen medicines. If you need help quitting, ask your health care provider. If you are hospitalized, prevention measures may include:  Early  walking after surgery, as soon as your health care provider says that it is safe.  Receiving anticoagulants to prevent blood clots.If you cannot take anticoagulants, other options may be available, such as wearing compression stockings or using different types of devices. Get help right away if:  You have new or increased pain, swelling, or redness in an arm or leg.  You have numbness or tingling in an arm or leg.  You have shortness of breath while active or at rest.  You have chest pain.  You have a rapid or irregular heartbeat.  You feel light-headed or dizzy.  You cough up blood.  You notice blood in your vomit, bowel movement, or urine. These symptoms may represent a serious problem that  is an emergency. Do not wait to see if the symptoms will go away. Get medical help right away. Call your local emergency services (911 in the [place] Do not drive yourself to the hospital.  This information is not intended to replace advice given to you by your health care provider. Make sure you discuss any questions you have with your health care provider. Document Released: 09/05/2005 Document Revised: 02/11/2016 Document Reviewed: 12/31/2014 Elsevier Interactive Patient Education  2017 [employer] Inc.     Hypertension Hypertension, commonly called high blood pressure, is when the force of blood pumping through your arteries is too strong. Your arteries are the blood vessels that carry blood from your heart throughout your body. A blood pressure reading consists of a higher number over a lower number, such as 110/72. The higher number (systolic) is the pressure inside your arteries when your heart pumps. The lower number (diastolic) is the pressure inside your arteries when your heart relaxes. Ideally you want your blood pressure below 120/80. Hypertension forces your heart to work harder to pump blood. Your arteries may become narrow or stiff. Having untreated or uncontrolled hypertension can  cause heart attack, stroke, kidney disease, and other problems. What increases the risk? Some risk factors for high blood pressure are controllable. Others are not. Risk factors you cannot control include:  Race. You may be at higher risk if you are African American.  Age. Risk increases with age.  Gender. Men are at higher risk than women before age 51 years. After age 15, women are at higher risk than men. Risk factors you can control include:  Not getting enough exercise or physical activity.  Being overweight.  Getting too much fat, sugar, calories, or salt in your diet.  Drinking too much alcohol. What are the signs or symptoms? Hypertension does not usually cause signs or symptoms. Extremely high blood pressure (hypertensive crisis) may cause headache, anxiety, shortness of breath, and nosebleed. How is this diagnosed? To check if you have hypertension, your health care provider will measure your blood pressure while you are seated, with your arm held at the level of your heart. It should be measured at least twice using the same arm. Certain conditions can cause a difference in blood pressure between your right and left arms. A blood pressure reading that is higher than normal on one occasion does not mean that you need treatment. If it is not clear whether you have high blood pressure, you may be asked to return on a different day to have your blood pressure checked again. Or, you may be asked to monitor your blood pressure at home for 1 or more weeks. How is this treated? Treating high blood pressure includes making lifestyle changes and possibly taking medicine. Living a healthy lifestyle can help lower high blood pressure. You may need to change some of your habits. Lifestyle changes may include:  Following the DASH diet. This diet is high in fruits, vegetables, and whole grains. It is low in salt, red meat, and added sugars.  Keep your sodium intake below 2,300 mg per  day.  Getting at least 30-45 minutes of aerobic exercise at least 4 times per week.  Losing weight if necessary.  Not smoking.  Limiting alcoholic beverages.  Learning ways to reduce stress. Your health care provider may prescribe medicine if lifestyle changes are not enough to get your blood pressure under control, and if one of the following is true:  You are 80-65 years of age and  your systolic blood pressure is above 140.  You are 24 years of age or older, and your systolic blood pressure is above 150.  Your diastolic blood pressure is above 90.  You have diabetes, and your systolic blood pressure is over 140 or your diastolic blood pressure is over 90.  You have kidney disease and your blood pressure is above 140/90.  You have heart disease and your blood pressure is above 140/90. Your personal target blood pressure may vary depending on your medical conditions, your age, and other factors. Follow these instructions at home:  Have your blood pressure rechecked as directed by your health care provider.  Take medicines only as directed by your health care provider. Follow the directions carefully. Blood pressure medicines must be taken as prescribed. The medicine does not work as well when you skip doses. Skipping doses also puts you at risk for problems.  Do not smoke.  Monitor your blood pressure at home as directed by your health care provider. Contact a health care provider if:  You think you are having a reaction to medicines taken.  You have recurrent headaches or feel dizzy.  You have swelling in your ankles.  You have trouble with your vision. Get help right away if:  You develop a severe headache or confusion.  You have unusual weakness, numbness, or feel faint.  You have severe chest or abdominal pain.  You vomit repeatedly.  You have trouble breathing. This information is not intended to replace advice given to you by your health care provider. Make  sure you discuss any questions you have with your health care provider. Document Released: 09/05/2005 Document Revised: 02/11/2016 Document Reviewed: 06/28/2013 Elsevier Interactive Patient Education  2017 [employer].    IF you received an x-ray today, you will receive an invoice from [facility]. Please contact [facility] at [phone] with questions or concerns regarding your invoice.   IF you received labwork today, you will receive an invoice from [place]. Please contact [facility] at [phone] with questions or concerns regarding your invoice.   Our billing staff will not be able to assist you with questions regarding bills from these companies.  You will be contacted with the lab results as soon as they are available. The fastest way to get your results is to activate your My Chart account. Instructions are located on the last page of this paperwork. If you have not heard from [place] regarding the results in 2 weeks, please contact this office.

## 2016-11-08 NOTE — Progress Notes (Signed)
VASCULAR LAB PRELIMINARY  PRELIMINARY  PRELIMINARY  PRELIMINARY  Left lower extremity venous duplex completed.    Preliminary report:  Left - No evidence of an acute DVT , superficial thrombosis, or Baker's cyst. Left popliteal vein still shows evidence of a chronic DVT noted on the exam of 01/10/2015.  Iden Stripling, RVS 11/08/2016, 10:57 AM

## 2016-11-08 NOTE — Progress Notes (Signed)
Negative DVT report received from Vascular Lab  [facility]

## 2016-11-08 NOTE — Progress Notes (Addendum)
MRN: [id number] DOB: 29-May-1961  Subjective:   Richard Leon is a 56 y.o. male presenting for chief complaint of Leg Swelling (LEFT)  Reports 5 day history of worsening left lower leg swelling, pain, warmth, redness. Denies recent long-distance travel, trauma, falls. Has a history of blood clot, 2016, was managed with Xarelto briefly for 2-3 months. Denies smoking cigarettes. Denies fever, chest pain, shob, heart racing. Patient has a history of HTN, he has not been taking his BP medications for a "while".   [name] is not currently taking any medications. Also has No Known Allergies.  Richard Leon  has a past medical history of Hypertension. Also  has a past surgical history that includes None.  Objective:   Vitals: BP (!) 144/92 (BP Location: Right Arm, Patient Position: Sitting, Cuff Size: Large)   Pulse 68   Temp 97.8 F (36.6 C) (Oral)   Resp 18   Ht 5\' 7"  (1.702 m)   Wt (!) 334 lb (151.5 kg)   SpO2 96%   BMI 52.31 kg/m   BP Readings from Last 3 Encounters:  11/08/16 (!) 144/92  03/08/16 136/96  11/17/15 (!) 145/89    Physical Exam  Constitutional: He is oriented to person, place, and time. He appears well-developed and well-nourished.  Cardiovascular: Normal rate, regular rhythm and intact distal pulses.  Exam reveals no gallop and no friction rub.   No murmur heard. Pulmonary/Chest: No respiratory distress. He has no wheezes. He has no rales.  Musculoskeletal:       Left lower leg: He exhibits tenderness (Positive Homann sign, left calf measures 52cm at 13cm inferior to patella compared to 48 cm for right calf) and swelling (with associated warmth). He exhibits no bony tenderness.  Neurological: He is alert and oriented to person, place, and time.  Skin: Skin is warm and dry. No rash noted.   ECG interpretation - sinus rhythm compared to ecg from 02/13/2014, no acute changes.  Dg Chest 2 View  Result Date: 11/08/2016 CLINICAL DATA:  left lower leg swelling,  uncontrolled HTN EXAM: CHEST - 2 VIEW COMPARISON:  05/14/2015 FINDINGS: Mild central pulmonary vascular congestion. Mild bibasilar interstitial prominence. No confluent airspace disease. Heart size and mediastinal contours are within normal limits. Tortuous thoracic aorta. No effusion.  No pneumothorax. Visualized bones unremarkable. IMPRESSION: Central pulmonary vascular congestion, increased since prior study Electronically Signed   By: Corlis Leak M.D.   On: 11/08/2016 09:12    Assessment and Plan :   This case was precepted with Dr. Neva Seat.   1. Pain and swelling of left lower leg 2. History of DVT of lower extremity - Stat U/S pending, labs pending. ECG and chest x-ray are reassuring.   3. Essential hypertension 4. Elevated blood pressure reading 5. Morbid obesity (HCC) - Labs pending, start amlodipine, HCTZ.  Richard Bamberg, PA-C [facility] at [facility] [phone] 11/08/2016  8:52 AM  UPDATE:  Left lower extremity venous duplex completed.    Preliminary report:  Left - No evidence of an acute DVT , superficial thrombosis, or Baker's cyst. Left popliteal vein still shows evidence of a chronic DVT noted on the exam of 01/10/2015.  Richard Leon, RVS 11/08/2016, 10:57 AM  I left a message for hematologist on call, at [phone]. I requested guidance regarding management of this patient. Given the chronic nature of his proximal dvt, I will look to discuss using Xarelto with close follow up with the hematologist on call.  UPDATE: Spoke with Dr. Candise Che  regarding this case. He appears to have an acute on chronic proximal DVT. Patient is to start Xarelto 15mg  BID for 3 weeks followed by 20mg  daily for 6 months. Interval U/S should be based off of clinical findings. Patient did not have signs of PE. Plan is to refer urgently to cardiology depending on bnp level.

## 2016-11-09 LAB — BASIC METABOLIC PANEL
BUN/Creatinine Ratio: 12 (ref 9–20)
BUN: 11 mg/dL (ref 6–24)
CALCIUM: 9.5 mg/dL (ref 8.7–10.2)
CO2: 27 mmol/L (ref 18–29)
Chloride: 103 mmol/L (ref 96–106)
Creatinine, Ser: 0.9 mg/dL (ref 0.76–1.27)
GFR calc Af Amer: 109 (ref >59)
GFR, EST NON AFRICAN AMERICAN: 94 (ref >59)
Glucose: 80 mg/dL (ref 65–99)
POTASSIUM: 4.5 mmol/L (ref 3.5–5.2)
Sodium: 143 mmol/L (ref 134–144)

## 2016-11-09 LAB — CBC
HEMOGLOBIN: 14.7 g/dL (ref 13.0–17.7)
Hematocrit: 44.5 % (ref 37.5–51.0)
MCH: 28.9 pg (ref 26.6–33.0)
MCHC: 33 g/dL (ref 31.5–35.7)
MCV: 88 fL (ref 79–97)
Platelets: 204 10*3/uL (ref 150–379)
RBC: 5.08 x10E6/uL (ref 4.14–5.80)
RDW: 14.6 % (ref 12.3–15.4)
WBC: 6 10*3/uL (ref 3.4–10.8)

## 2016-11-09 LAB — TSH: TSH: 1.42 u[IU]/mL (ref 0.450–4.500)

## 2016-11-09 LAB — BRAIN NATRIURETIC PEPTIDE: BNP: 9.1 pg/mL (ref 0.0–100.0)

## 2016-12-08 ENCOUNTER — Ambulatory Visit (INDEPENDENT_AMBULATORY_CARE_PROVIDER_SITE_OTHER): Payer: 59 | Admitting: Urgent Care

## 2016-12-08 ENCOUNTER — Encounter: Payer: Self-pay | Admitting: Urgent Care

## 2016-12-08 VITALS — BP 132/100 | HR 79 | Temp 98.0°F | Resp 16 | Ht 66.5 in | Wt 338.0 lb

## 2016-12-08 DIAGNOSIS — Z7901 Long term (current) use of anticoagulants: Secondary | ICD-10-CM

## 2016-12-08 DIAGNOSIS — I82532 Chronic embolism and thrombosis of left popliteal vein: Secondary | ICD-10-CM | POA: Diagnosis not present

## 2016-12-08 DIAGNOSIS — I10 Essential (primary) hypertension: Secondary | ICD-10-CM | POA: Diagnosis not present

## 2016-12-08 DIAGNOSIS — R0989 Other specified symptoms and signs involving the circulatory and respiratory systems: Secondary | ICD-10-CM | POA: Diagnosis not present

## 2016-12-08 MED ORDER — RIVAROXABAN 20 MG PO TABS: 20.0000 mg | ORAL_TABLET | Freq: Every day | ORAL | 1 refills | Status: DC

## 2016-12-08 NOTE — Progress Notes (Signed)
   MRN: [id number] DOB: 03/16/1961  Subjective:   Richard Leon is a 56 y.o. male presenting for follow up on acute on chronic dvt of left lower leg. He was restarted on Xarelto on 11/08/2016. Has been using Xarelto 15mg  BID. Plan was to switch over to Xarelto 20mg  QD for 6 months. Reports significant improvement in lower leg pain, swelling, redness, warmth. Denies episodes of bleeding, bloody stools, bleeding gums. He is tolerating BP medications well. Denies dizziness, chronic headache, blurred vision, chest pain, shortness of breath, dyspnea, heart racing, palpitations, nausea, vomiting, abdominal pain, hematuria. Denies smoking cigarettes.   Richard Leon has a current medication list which includes the following prescription(s): amlodipine, hydrochlorothiazide, and rivaroxaban. Also has No Known Allergies. Richard Leon  has a past medical history of Hypertension. Also  has a past surgical history that includes None.  Objective:   Vitals: BP (!) 130/98   Pulse 79   Temp 98 F (36.7 C) (Oral)   Resp 16   Ht 5' 6.5" (1.689 m)   Wt (!) 338 lb (153.3 kg)   SpO2 96%   BMI 53.74 kg/m   BP Readings from Last 3 Encounters:  12/08/16 (!) 130/98  11/08/16 (!) 144/92  03/08/16 136/96    Wt Readings from Last 3 Encounters:  12/08/16 (!) 338 lb (153.3 kg)  11/08/16 (!) 334 lb (151.5 kg)  03/07/16 (!) 315 lb 5 oz (143 kg)    Physical Exam  Constitutional: He is oriented to person, place, and time. He appears well-developed and well-nourished.  Cardiovascular: Normal rate, regular rhythm and intact distal pulses.  Exam reveals no gallop and no friction rub.   No murmur heard. Pulmonary/Chest: No respiratory distress. He has no wheezes. He has no rales.  Musculoskeletal: He exhibits edema (1+ pitting edema up to mid calves bilaterally).       Left lower leg: He exhibits swelling (left calf measures 51cm at 13cm inferior to patella) and edema. He exhibits no tenderness and no bony tenderness.    Neurological: He is alert and oriented to person, place, and time.  Skin: Skin is warm and dry.   Assessment and Plan :   1. Chronic deep vein thrombosis (DVT) of popliteal vein of left lower extremity (HCC) 2. On continuous oral anticoagulation - Improved, continue Xarelto 20mg  daily for the next 3 months. Plan is to image at that point, likely will continue Xarelto 20mg  and refer to vascular surgery or hematology then as well. Patient is in agreement.  3. Pulmonary vascular congestion 4. Essential hypertension 5. Morbid obesity (HCC) - Will refer to cardiology for consult, screen for heart disease in setting of chronic dvt, pulmonary vascular congestion, HTN, morbid obesity. - Counseled on significant dietary modifications. Increase HCTZ to 25mg  daily. Will recheck in 1 month. - Ambulatory referral to Cardiology   Wallis BambergMario Buena Boehm, PA-C [facility] [phone] 12/08/2016 8:49 AM

## 2016-12-08 NOTE — Patient Instructions (Addendum)
Salads - Kale, Spinach, Cabbage, Spring mix Fruits - Avocadoes, berries (blueberries, raspberries, blackberries), apples, oranges Seeds - Quinoa, Chia seeds Vegetables - aspargus, mashed cauliflower, broccoli, green beans, brussel spouts, bell peppers  Brussel sprouts - Cut off stems. Place in a mixing bowl that has a lid. Pour in a 1/4-1/2 cup olive oil, spices, use a light amount of parmesan. Place on a baking sheet. Bake for 10 minutes at 400F. Take it out, eat the brussel chips. Place for another 5-10 minutes.   Vega protein is good protein powder brand, make sure you use ~6 ice cubes to give it smoothie consistency together with ~4-6 ounces of vanilla soy milk. Throw cinnamon into your shake, use peanut butter.     Hypertension Hypertension, commonly called high blood pressure, is when the force of blood pumping through the arteries is too strong. The arteries are the blood vessels that carry blood from the heart throughout the body. Hypertension forces the heart to work harder to pump blood and may cause arteries to become narrow or stiff. Having untreated or uncontrolled hypertension can cause heart attacks, strokes, kidney disease, and other problems. A blood pressure reading consists of a higher number over a lower number. Ideally, your blood pressure should be below 120/80. The first ("top") number is called the systolic pressure. It is a measure of the pressure in your arteries as your heart beats. The second ("bottom") number is called the diastolic pressure. It is a measure of the pressure in your arteries as the heart relaxes. What are the causes? The cause of this condition is not known. What increases the risk? Some risk factors for high blood pressure are under your control. Others are not. Factors you can change   Smoking.  Having type 2 diabetes mellitus, high cholesterol, or both.  Not getting enough exercise or physical activity.  Being overweight.  Having too much  fat, sugar, calories, or salt (sodium) in your diet.  Drinking too much alcohol. Factors that are difficult or impossible to change   Having chronic kidney disease.  Having a family history of high blood pressure.  Age. Risk increases with age.  Race. You may be at higher risk if you are African-American.  Gender. Men are at higher risk than women before age 41. After age 83, women are at higher risk than men.  Having obstructive sleep apnea.  Stress. What are the signs or symptoms? Extremely high blood pressure (hypertensive crisis) may cause:  Headache.  Anxiety.  Shortness of breath.  Nosebleed.  Nausea and vomiting.  Severe chest pain.  Jerky movements you cannot control (seizures). How is this diagnosed? This condition is diagnosed by measuring your blood pressure while you are seated, with your arm resting on a surface. The cuff of the blood pressure monitor will be placed directly against the skin of your upper arm at the level of your heart. It should be measured at least twice using the same arm. Certain conditions can cause a difference in blood pressure between your right and left arms. Certain factors can cause blood pressure readings to be lower or higher than normal (elevated) for a short period of time:  When your blood pressure is higher when you are in a health care provider's office than when you are at home, this is called white coat hypertension. Most people with this condition do not need medicines.  When your blood pressure is higher at home than when you are in a health care provider's office,  this is called masked hypertension. Most people with this condition may need medicines to control blood pressure. If you have a high blood pressure reading during one visit or you have normal blood pressure with other risk factors:  You may be asked to return on a different day to have your blood pressure checked again.  You may be asked to monitor your blood  pressure at home for 1 week or longer. If you are diagnosed with hypertension, you may have other blood or imaging tests to help your health care provider understand your overall risk for other conditions. How is this treated? This condition is treated by making healthy lifestyle changes, such as eating healthy foods, exercising more, and reducing your alcohol intake. Your health care provider may prescribe medicine if lifestyle changes are not enough to get your blood pressure under control, and if:  Your systolic blood pressure is above 130.  Your diastolic blood pressure is above 80. Your personal target blood pressure may vary depending on your medical conditions, your age, and other factors. Follow these instructions at home: Eating and drinking   Eat a diet that is high in fiber and potassium, and low in sodium, added sugar, and fat. An example eating plan is called the DASH (Dietary Approaches to Stop Hypertension) diet. To eat this way:  Eat plenty of fresh fruits and vegetables. Try to fill half of your plate at each meal with fruits and vegetables.  Eat whole grains, such as whole wheat pasta, brown rice, or whole grain bread. Fill about one quarter of your plate with whole grains.  Eat or drink low-fat dairy products, such as skim milk or low-fat yogurt.  Avoid fatty cuts of meat, processed or cured meats, and poultry with skin. Fill about one quarter of your plate with lean proteins, such as fish, chicken without skin, beans, eggs, and tofu.  Avoid premade and processed foods. These tend to be higher in sodium, added sugar, and fat.  Reduce your daily sodium intake. Most people with hypertension should eat less than 1,500 mg of sodium a day.  Limit alcohol intake to no more than 1 drink a day for nonpregnant women and 2 drinks a day for men. One drink equals 12 oz of beer, 5 oz of wine, or 1 oz of hard liquor. Lifestyle   Work with your health care provider to maintain a  healthy body weight or to lose weight. Ask what an ideal weight is for you.  Get at least 30 minutes of exercise that causes your heart to beat faster (aerobic exercise) most days of the week. Activities may include walking, swimming, or biking.  Include exercise to strengthen your muscles (resistance exercise), such as pilates or lifting weights, as part of your weekly exercise routine. Try to do these types of exercises for 30 minutes at least 3 days a week.  Do not use any products that contain nicotine or tobacco, such as cigarettes and e-cigarettes. If you need help quitting, ask your health care provider.  Monitor your blood pressure at home as told by your health care provider.  Keep all follow-up visits as told by your health care provider. This is important. Medicines   Take over-the-counter and prescription medicines only as told by your health care provider. Follow directions carefully. Blood pressure medicines must be taken as prescribed.  Do not skip doses of blood pressure medicine. Doing this puts you at risk for problems and can make the medicine less effective.  Ask your health care provider about side effects or reactions to medicines that you should watch for. Contact a health care provider if:  You think you are having a reaction to a medicine you are taking.  You have headaches that keep coming back (recurring).  You feel dizzy.  You have swelling in your ankles.  You have trouble with your vision. Get help right away if:  You develop a severe headache or confusion.  You have unusual weakness or numbness.  You feel faint.  You have severe pain in your chest or abdomen.  You vomit repeatedly.  You have trouble breathing. Summary  Hypertension is when the force of blood pumping through your arteries is too strong. If this condition is not controlled, it may put you at risk for serious complications.  Your personal target blood pressure may vary  depending on your medical conditions, your age, and other factors. For most people, a normal blood pressure is less than 120/80.  Hypertension is treated with lifestyle changes, medicines, or a combination of both. Lifestyle changes include weight loss, eating a healthy, low-sodium diet, exercising more, and limiting alcohol. This information is not intended to replace advice given to you by your health care provider. Make sure you discuss any questions you have with your health care provider. Document Released: 09/05/2005 Document Revised: 08/03/2016 Document Reviewed: 08/03/2016 Elsevier Interactive Patient Education  2017 [employer].     IF you received an x-ray today, you will receive an invoice from [facility]. Please contact [facility] at [phone] with questions or concerns regarding your invoice.   IF you received labwork today, you will receive an invoice from [facility]. Please contact [facility] at [phone] with questions or concerns regarding your invoice.   Our billing staff will not be able to assist you with questions regarding bills from these companies.  You will be contacted with the lab results as soon as they are available. The fastest way to get your results is to activate your My Chart account. Instructions are located on the last page of this paperwork. If you have not heard from [place] regarding the results in 2 weeks, please contact this office.

## 2016-12-09 LAB — CMP14+EGFR
A/G RATIO: 1.3 (ref 1.2–2.2)
ALBUMIN: 3.9 g/dL (ref 3.5–5.5)
ALT: 10 IU/L (ref 0–44)
AST: 17 IU/L (ref 0–40)
Alkaline Phosphatase: 82 IU/L (ref 39–117)
BUN / CREAT RATIO: 13 (ref 9–20)
BUN: 13 mg/dL (ref 6–24)
Bilirubin Total: 0.6 mg/dL (ref 0.0–1.2)
CO2: 26 mmol/L (ref 18–29)
Calcium: 9.8 mg/dL (ref 8.7–10.2)
Chloride: 103 mmol/L (ref 96–106)
Creatinine, Ser: 1.01 mg/dL (ref 0.76–1.27)
GFR calc Af Amer: 95 mL/min/{1.73_m2} (ref >59)
GFR calc non Af Amer: 82 mL/min/{1.73_m2} (ref >59)
GLOBULIN, TOTAL: 3.1 g/dL (ref 1.5–4.5)
Glucose: 81 mg/dL (ref 65–99)
POTASSIUM: 4.5 mmol/L (ref 3.5–5.2)
SODIUM: 143 mmol/L (ref 134–144)
Total Protein: 7 g/dL (ref 6.0–8.5)

## 2016-12-09 LAB — CBC WITH DIFFERENTIAL/PLATELET
Basophils Absolute: 0 10*3/uL (ref 0.0–0.2)
Basos: 0 %
EOS (ABSOLUTE): 0.1 10*3/uL (ref 0.0–0.4)
EOS: 2 %
HEMATOCRIT: 43.7 % (ref 37.5–51.0)
Hemoglobin: 14.2 g/dL (ref 13.0–17.7)
Immature Grans (Abs): 0 10*3/uL (ref 0.0–0.1)
Immature Granulocytes: 0 %
LYMPHS ABS: 2 10*3/uL (ref 0.7–3.1)
Lymphs: 32 %
MCH: 28.7 pg (ref 26.6–33.0)
MCHC: 32.5 g/dL (ref 31.5–35.7)
MCV: 88 fL (ref 79–97)
MONOS ABS: 0.4 10*3/uL (ref 0.1–0.9)
Monocytes: 7 %
NEUTROS ABS: 3.7 10*3/uL (ref 1.4–7.0)
Neutrophils: 59 %
Platelets: 224 10*3/uL (ref 150–379)
RBC: 4.95 x10E6/uL (ref 4.14–5.80)
RDW: 14.2 % (ref 12.3–15.4)
WBC: 6.3 10*3/uL (ref 3.4–10.8)

## 2016-12-12 ENCOUNTER — Encounter: Payer: Self-pay | Admitting: Urgent Care

## 2016-12-23 DIAGNOSIS — J3089 Other allergic rhinitis: Secondary | ICD-10-CM | POA: Diagnosis not present

## 2017-01-12 ENCOUNTER — Ambulatory Visit: Payer: 59 | Admitting: Urgent Care

## 2017-07-05 IMAGING — CR DG CHEST 2V
2 series · 2 of 2 positions shown · non-contrast
Comparison: 10/04/2013.

CLINICAL DATA: Cough, shortness of breath and fever.

EXAM:
CHEST  2 VIEW

[PA]
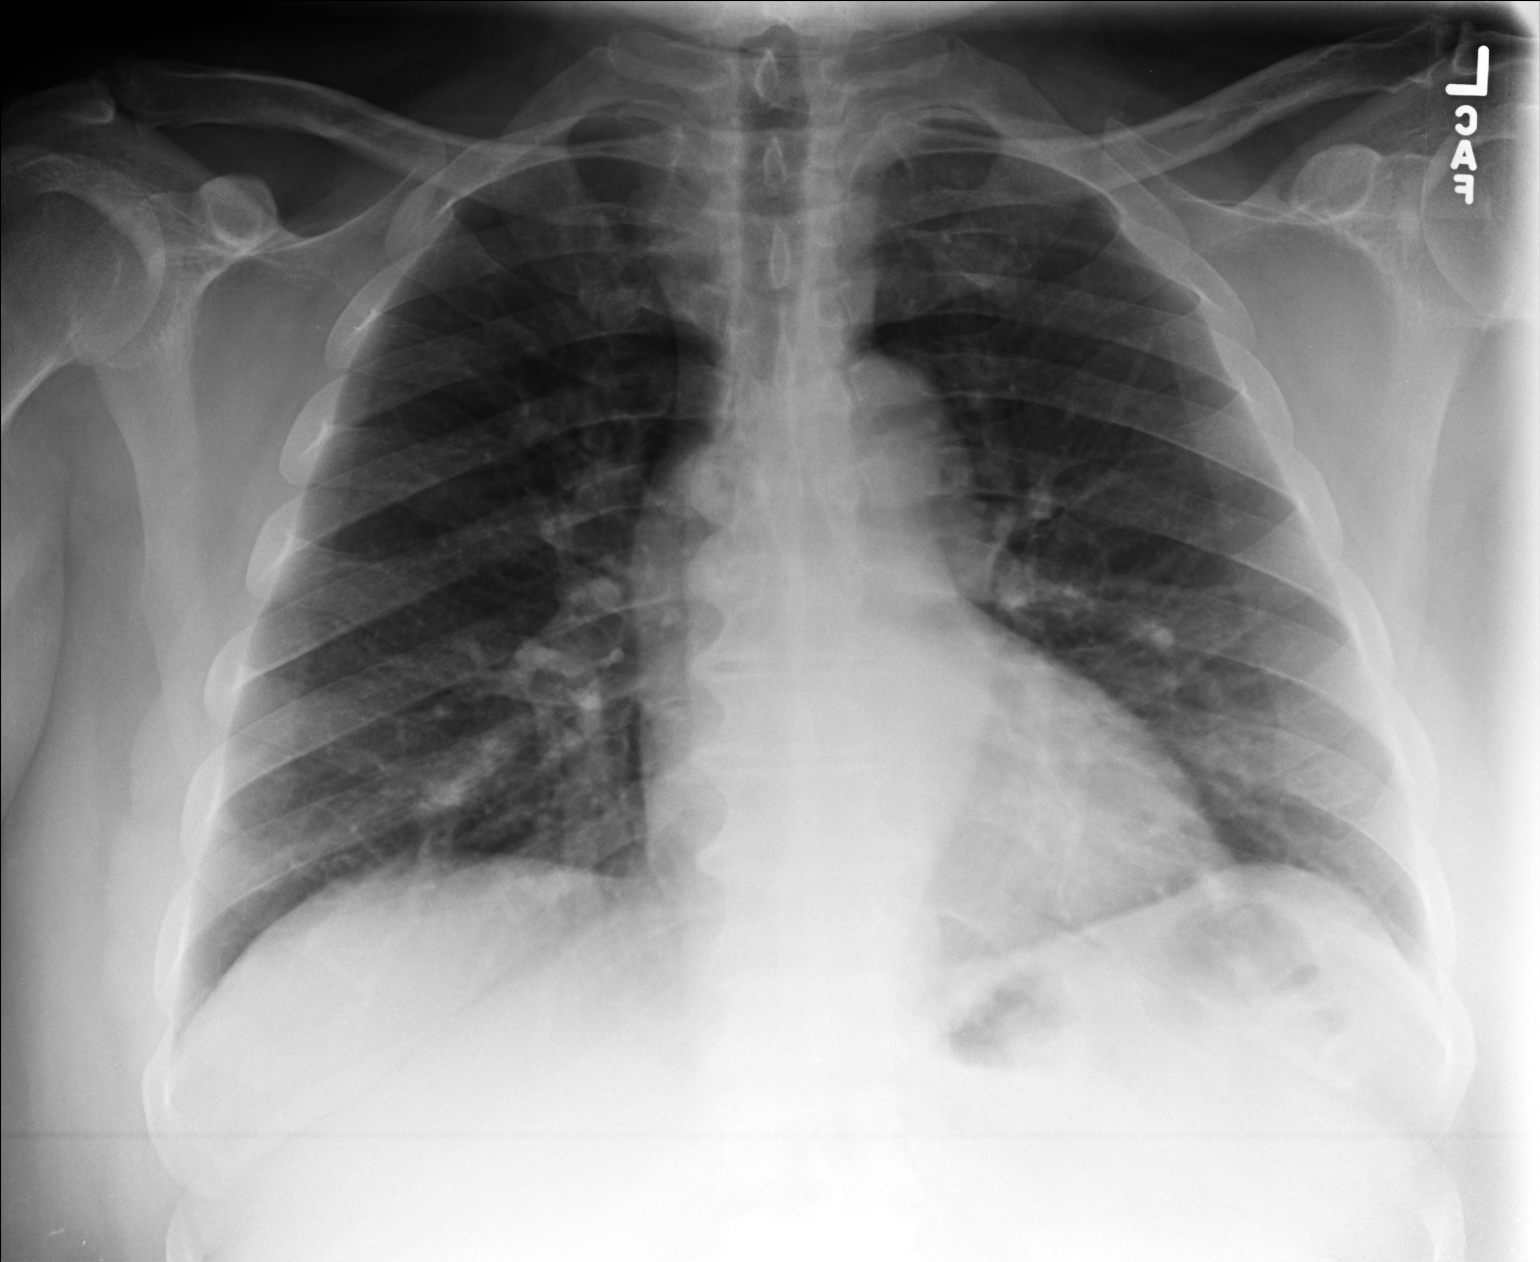

[lateral]
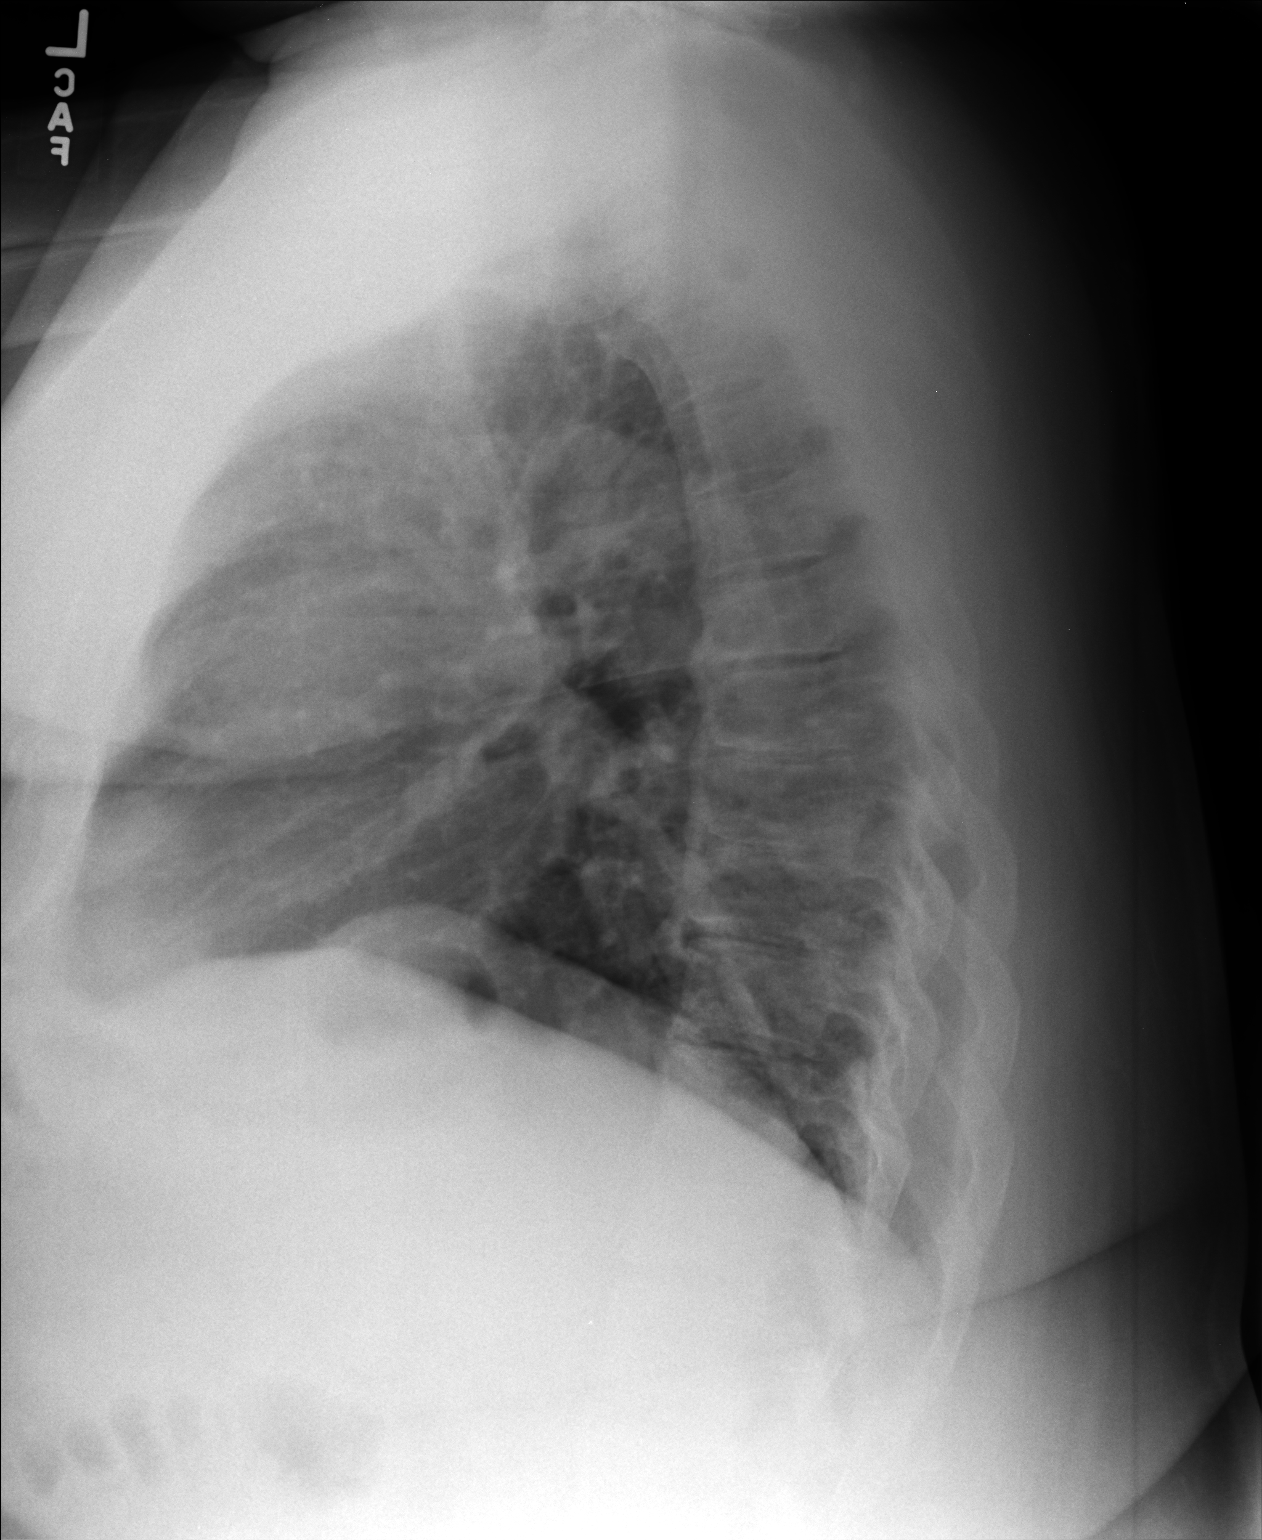

[2 of 2 positions shown; findings below may reference images not displayed]

FINDINGS: The cardiac silhouette, mediastinal and hilar contours are stable.
There is mild tortuosity of the thoracic aorta. Low lung volumes
with vascular crowding and streaky basilar atelectasis. No definite
infiltrates or effusions. The bony thorax is intact.
IMPRESSION: Low lung volumes with vascular crowding and bibasilar atelectasis.
No definite infiltrates or effusions.

## 2017-10-18 ENCOUNTER — Emergency Department (HOSPITAL_BASED_OUTPATIENT_CLINIC_OR_DEPARTMENT_OTHER): Payer: 59

## 2017-10-18 ENCOUNTER — Inpatient Hospital Stay (HOSPITAL_BASED_OUTPATIENT_CLINIC_OR_DEPARTMENT_OTHER)
Admission: EM | Admit: 2017-10-18 | Discharge: 2017-10-20 | DRG: 176 | Disposition: A | Discharge status: Home / Self Care | Payer: 59 | Attending: Internal Medicine | Admitting: Internal Medicine

## 2017-10-18 ENCOUNTER — Ambulatory Visit: Payer: Self-pay

## 2017-10-18 ENCOUNTER — Other Ambulatory Visit: Payer: Self-pay

## 2017-10-18 ENCOUNTER — Encounter (HOSPITAL_BASED_OUTPATIENT_CLINIC_OR_DEPARTMENT_OTHER): Payer: Self-pay | Admitting: Emergency Medicine

## 2017-10-18 DIAGNOSIS — Z8249 Family history of ischemic heart disease and other diseases of the circulatory system: Secondary | ICD-10-CM

## 2017-10-18 DIAGNOSIS — I824Z2 Acute embolism and thrombosis of unspecified deep veins of left distal lower extremity: Secondary | ICD-10-CM | POA: Diagnosis not present

## 2017-10-18 DIAGNOSIS — I10 Essential (primary) hypertension: Secondary | ICD-10-CM | POA: Diagnosis not present

## 2017-10-18 DIAGNOSIS — I2602 Saddle embolus of pulmonary artery with acute cor pulmonale: Principal | ICD-10-CM | POA: Diagnosis present

## 2017-10-18 DIAGNOSIS — Z825 Family history of asthma and other chronic lower respiratory diseases: Secondary | ICD-10-CM | POA: Diagnosis not present

## 2017-10-18 DIAGNOSIS — I82432 Acute embolism and thrombosis of left popliteal vein: Secondary | ICD-10-CM | POA: Diagnosis present

## 2017-10-18 DIAGNOSIS — Z9119 Patient's noncompliance with other medical treatment and regimen: Secondary | ICD-10-CM

## 2017-10-18 DIAGNOSIS — R0602 Shortness of breath: Secondary | ICD-10-CM | POA: Diagnosis not present

## 2017-10-18 DIAGNOSIS — I2699 Other pulmonary embolism without acute cor pulmonale: Secondary | ICD-10-CM | POA: Diagnosis not present

## 2017-10-18 DIAGNOSIS — Z6841 Body Mass Index (BMI) 40.0 and over, adult: Secondary | ICD-10-CM | POA: Diagnosis not present

## 2017-10-18 DIAGNOSIS — I82412 Acute embolism and thrombosis of left femoral vein: Secondary | ICD-10-CM | POA: Diagnosis present

## 2017-10-18 DIAGNOSIS — I160 Hypertensive urgency: Secondary | ICD-10-CM | POA: Diagnosis present

## 2017-10-18 DIAGNOSIS — Z8261 Family history of arthritis: Secondary | ICD-10-CM

## 2017-10-18 DIAGNOSIS — Z841 Family history of disorders of kidney and ureter: Secondary | ICD-10-CM

## 2017-10-18 DIAGNOSIS — I82532 Chronic embolism and thrombosis of left popliteal vein: Secondary | ICD-10-CM | POA: Diagnosis present

## 2017-10-18 HISTORY — DX: Acute embolism and thrombosis of unspecified deep veins of unspecified lower extremity: I82.409

## 2017-10-18 LAB — CBC WITH DIFFERENTIAL/PLATELET
BASOS ABS: 0 10*3/uL (ref 0.0–0.1)
Basophils Relative: 0 %
EOS ABS: 0.1 10*3/uL (ref 0.0–0.7)
EOS PCT: 2 %
HCT: 40.6 % (ref 39.0–52.0)
Hemoglobin: 13.8 g/dL (ref 13.0–17.0)
Lymphocytes Relative: 28 %
Lymphs Abs: 2.1 10*3/uL (ref 0.7–4.0)
MCH: 27.9 pg (ref 26.0–34.0)
MCHC: 34 g/dL (ref 30.0–36.0)
MCV: 82 fL (ref 78.0–100.0)
Monocytes Absolute: 0.7 10*3/uL (ref 0.1–1.0)
Monocytes Relative: 10 %
Neutro Abs: 4.4 10*3/uL (ref 1.7–7.7)
Neutrophils Relative %: 60 %
PLATELETS: 124 10*3/uL — AB (ref 150–400)
RBC: 4.95 MIL/uL (ref 4.22–5.81)
RDW: 15.2 % (ref 11.5–15.5)
WBC: 7.3 10*3/uL (ref 4.0–10.5)

## 2017-10-18 LAB — COMPREHENSIVE METABOLIC PANEL
ALK PHOS: 74 U/L (ref 38–126)
ALT: 15 U/L — ABNORMAL LOW (ref 17–63)
ANION GAP: 9 (ref 5–15)
AST: 22 U/L (ref 15–41)
Albumin: 3.6 g/dL (ref 3.5–5.0)
BILIRUBIN TOTAL: 1.2 mg/dL (ref 0.3–1.2)
BUN: 13 mg/dL (ref 6–20)
CALCIUM: 9.4 mg/dL (ref 8.9–10.3)
CO2: 23 mmol/L (ref 22–32)
Chloride: 106 mmol/L (ref 101–111)
Creatinine, Ser: 0.83 mg/dL (ref 0.61–1.24)
Glucose, Bld: 84 mg/dL (ref 65–99)
Potassium: 3.5 mmol/L (ref 3.5–5.1)
Sodium: 138 mmol/L (ref 135–145)
TOTAL PROTEIN: 7.4 g/dL (ref 6.5–8.1)

## 2017-10-18 LAB — D-DIMER, QUANTITATIVE (NOT AT ARMC)

## 2017-10-18 LAB — BRAIN NATRIURETIC PEPTIDE: B Natriuretic Peptide: 50.5 pg/mL (ref 0.0–100.0)

## 2017-10-18 LAB — TROPONIN I
TROPONIN I: 0.04 ng/mL — AB (ref <0.03)
TROPONIN I: 0.03 ng/mL — AB (ref <0.03)

## 2017-10-18 LAB — HEPARIN LEVEL (UNFRACTIONATED): HEPARIN UNFRACTIONATED: 0.63 [IU]/mL (ref 0.30–0.70)

## 2017-10-18 LAB — MRSA PCR SCREENING: MRSA by PCR: NEGATIVE

## 2017-10-18 MED ORDER — IOPAMIDOL (ISOVUE-370) INJECTION 76%
100.0000 mL | Freq: Once | INTRAVENOUS | Status: AC | PRN
  Administered 2017-10-18: 100 mL via INTRAVENOUS

## 2017-10-18 MED ORDER — HEPARIN BOLUS VIA INFUSION
6000.0000 [IU] | Freq: Once | INTRAVENOUS | Status: AC
  Administered 2017-10-18: 6000 [IU] via INTRAVENOUS

## 2017-10-18 MED ORDER — ASPIRIN 81 MG PO CHEW
324.0000 mg | CHEWABLE_TABLET | Freq: Once | ORAL | Status: AC
  Administered 2017-10-18: 324 mg via ORAL
  Filled 2017-10-18: qty 4

## 2017-10-18 MED ORDER — HYDRALAZINE HCL 20 MG/ML IJ SOLN
10.0000 mg | INTRAMUSCULAR | Status: DC | PRN
  Administered 2017-10-18 – 2017-10-19 (×3): 10 mg via INTRAVENOUS
  Filled 2017-10-18 (×3): qty 1

## 2017-10-18 MED ORDER — ONDANSETRON HCL 4 MG PO TABS: 4.0000 mg | ORAL_TABLET | Freq: Four times a day (QID) | ORAL | Status: DC | PRN

## 2017-10-18 MED ORDER — HYDRALAZINE HCL 20 MG/ML IJ SOLN
10.0000 mg | Freq: Once | INTRAMUSCULAR | Status: AC
  Administered 2017-10-18: 10 mg via INTRAVENOUS
  Filled 2017-10-18: qty 1

## 2017-10-18 MED ORDER — HEPARIN (PORCINE) IN NACL 100-0.45 UNIT/ML-% IJ SOLN
1600.0000 [IU]/h | INTRAMUSCULAR | Status: DC
  Administered 2017-10-18 – 2017-10-19 (×2): 1600 [IU]/h via INTRAVENOUS
  Filled 2017-10-18: qty 250

## 2017-10-18 MED ORDER — HEPARIN (PORCINE) IN NACL 100-0.45 UNIT/ML-% IJ SOLN
INTRAMUSCULAR | Status: AC
  Filled 2017-10-18: qty 250

## 2017-10-18 MED ORDER — ACETAMINOPHEN 650 MG RE SUPP: 650.0000 mg | Freq: Four times a day (QID) | RECTAL | Status: DC | PRN

## 2017-10-18 MED ORDER — ONDANSETRON HCL 4 MG/2ML IJ SOLN: 4.0000 mg | Freq: Four times a day (QID) | INTRAMUSCULAR | Status: DC | PRN

## 2017-10-18 MED ORDER — ACETAMINOPHEN 325 MG PO TABS
650.0000 mg | ORAL_TABLET | Freq: Four times a day (QID) | ORAL | Status: DC | PRN
  Administered 2017-10-20: 650 mg via ORAL

## 2017-10-18 NOTE — ED Notes (Signed)
O2 saturation dropped to 88%  while ambulating

## 2017-10-18 NOTE — ED Notes (Signed)
Patient's wife at bedside. Patient and wife updated regarding this transfer.  No bed assignment at this time.  Informed them that I will keep them posted.  Verbalized understanding.

## 2017-10-18 NOTE — ED Provider Notes (Signed)
[facility] EMERGENCY DEPARTMENT Provider Note   CSN: [id number] Arrival date & time: 10/18/17  1305     History   Chief Complaint Chief Complaint  Patient presents with  . Shortness of Breath    HPI Richard Leon is a 57 y.o. male.  HPI  57 year old male presents with shortness of breath on exertion for the past 3 days.  He states that he first noticed it during exercise class.  When at rest or with no activity he has no symptoms.  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  He typically walks about 6 miles each weekend and normally is not this short of breath.  It seems to be happening with less and less exertion.  Currently while at rest he has no shortness of breath.  There is no orthopnea.  He denies any leg swelling, chest pain, or cough.  He has a prior history of DVT and states that he used to be on Xarelto but then this was stopped when he started taking care of his mother and put aside his own medical problems.  He also has hypertension but is not on medicine.  Past Medical History:  Diagnosis Date  . DVT (deep venous thrombosis) (HCC)   . Hypertension     Patient Active Problem List   Diagnosis Date Noted  . Pulmonary emboli (HCC) 10/18/2017  . Chronic deep vein thrombosis (DVT) of popliteal vein of left lower extremity (HCC) 12/08/2016  . Morbid obesity (HCC) 12/08/2016  . Abnormal CXR 02/13/2014  . HTN (hypertension) 10/04/2013    Past Surgical History:  Procedure Laterality Date  . None         Home Medications    Prior to Admission medications   Medication Sig Start Date End Date Taking? Authorizing Provider  amLODipine (NORVASC) 5 MG tablet Take 1 tablet (5 mg total) by mouth daily. 11/08/16   Wallis Bamberg, PA-C  hydrochlorothiazide (HYDRODIURIL) 12.5 MG tablet Take 1 tablet (12.5 mg total)  by mouth daily. 11/08/16   Wallis Bamberg, PA-C  rivaroxaban (XARELTO) 20 MG TABS tablet Take 1 tablet (20 mg total) by mouth daily with supper. 12/08/16   Wallis Bamberg, PA-C    Family History Family History  Problem Relation Age of Onset  . Arthritis Mother   . Hypertension Mother   . COPD Mother   . Kidney disease Sister   . Cancer Brother     Social History Social History   Tobacco Use  . Smoking status: Never Smoker  . Smokeless tobacco: Never Used  Substance Use Topics  . Alcohol use: No    Alcohol/week: 0.0 oz  . Drug use: No     Allergies   Patient has no known allergies.   Review of Systems Review of Systems  Constitutional: Negative for fever.  Respiratory: Positive for shortness of breath. Negative for cough.   Cardiovascular: Negative for chest pain and leg swelling.  All other systems reviewed and are negative.    Physical Exam Updated Vital Signs BP (!) 177/96 (BP Location: Left Arm)   Pulse 65   Temp 98.1 F (36.7 C) (Oral)   Resp 18   Ht 5\' 7"  (1.702 m)   Wt 121.1 kg (267 lb)   SpO2 96%   BMI 41.82 kg/m   Physical  Exam  Constitutional: He is oriented to person, place, and time. He appears well-developed and well-nourished.  Non-toxic appearance. He does not appear ill. No distress.  morbidly obese  HENT:  Head: Normocephalic and atraumatic.  Right Ear: External ear normal.  Left Ear: External ear normal.  Nose: Nose normal.  Eyes: Right eye exhibits no discharge. Left eye exhibits no discharge.  Neck: Neck supple.  Cardiovascular: Normal rate, regular rhythm and normal heart sounds.  Pulmonary/Chest: Effort normal and breath sounds normal. No accessory muscle usage. No tachypnea. No respiratory distress. He has no wheezes.  Abdominal: Soft. There is no tenderness.  Neurological: He is alert and oriented to person, place, and time.  Skin: Skin is warm and dry.  Nursing note and vitals reviewed.    ED Treatments / Results  Labs (all  labs ordered are listed, but only abnormal results are displayed) Labs Reviewed  COMPREHENSIVE METABOLIC PANEL - Abnormal; Notable for the following components:      Result Value   ALT 15 (*)    All other components within normal limits  TROPONIN I - Abnormal; Notable for the following components:   Troponin I 0.04 (*)    All other components within normal limits  CBC WITH DIFFERENTIAL/PLATELET - Abnormal; Notable for the following components:   Platelets 124 (*)    All other components within normal limits  D-DIMER, QUANTITATIVE (NOT AT [facility]) - Abnormal; Notable for the following components:   D-Dimer, Quant >20.00 (*)    All other components within normal limits  BRAIN NATRIURETIC PEPTIDE  HEPARIN LEVEL (UNFRACTIONATED)    EKG  EKG Interpretation  Date/Time:  Wednesday October 18 2017 13:30:33 EST Ventricular Rate:  66 PR Interval:    QRS Duration: 81 QT Interval:  417 QTC Calculation: 437 R Axis:   56 Text Interpretation:  Sinus rhythm Borderline T abnormalities, anterior leads Baseline wander in lead(s) V1 Confirmed by Pricilla Loveless [phone]) on 10/18/2017 2:17:35 PM       EKG Interpretation  Date/Time:  Wednesday October 18 2017 15:10:02 EST Ventricular Rate:  58 PR Interval:    QRS Duration: 86 QT Interval:  420 QTC Calculation: 413 R Axis:   37 Text Interpretation:  Sinus rhythm Atrial premature complex Low voltage, precordial leads Nonspecific T abnormalities, anterior leads no significant change since earlier in the day Confirmed by Pricilla Loveless [phone]) on 10/18/2017 3:16:06 PM Also confirmed by Pricilla Loveless [phone]), editor Sheppard Evens ([id number])  on 10/18/2017 3:57:47 PM       Radiology Dg Chest 2 View  Result Date: 10/18/2017 CLINICAL DATA:  Shortness of breath. EXAM: CHEST  2 VIEW COMPARISON:  11/08/2016 FINDINGS: The heart size and mediastinal contours are within normal limits. Pulmonary vascular congestion is again noted. Prominence of the pulmonary  arteries identified which may reflect PA hypertension. Both lungs are clear. The visualized skeletal structures are unremarkable. IMPRESSION: 1. Pulmonary vascular congestion. Electronically Signed   By: Signa Kell M.D.   On: 10/18/2017 14:13   Ct Angio Chest Pe W/cm &/or Wo Cm  Result Date: 10/18/2017 CLINICAL DATA:  PE suspected intermediate probability. EXAM: CT ANGIOGRAPHY CHEST WITH CONTRAST TECHNIQUE: Multidetector CT imaging of the chest was performed using the standard protocol during bolus administration of intravenous contrast. Multiplanar CT image reconstructions and MIPs were obtained to evaluate the vascular anatomy. CONTRAST:  ISOVUE-370 IOPAMIDOL (ISOVUE-370) INJECTION 76% COMPARISON:  None. FINDINGS: Cardiovascular: Normal heart size. No pericardial effusion. Large clot burden is identified within both central right  and left pulmonary arteries as well as the lobar and segmental pulmonary arteries of both lower lobes, and right upper lobe. Left upper lobe segmental pulmonary emboli also noted. The RV to LV ratio is equal to 1.2. Mediastinum/Nodes: The trachea appears patent and is midline. Normal appearance of the esophagus. No axillary or supraclavicular adenopathy. No mediastinal or hilar adenopathy. Lungs/Pleura: No pleural effusion. No airspace consolidation or atelectasis. Bandlike area of ground-glass attenuation within the left upper lobe is identified which may be the sequelae of pulmonary infarct. Upper Abdomen: No acute abnormality.  Gallstones noted. Musculoskeletal: Degenerative disc disease noted within the thoracic spine. No aggressive lytic or sclerotic bone lesions identified. Review of the MIP images confirms the above findings. IMPRESSION: 1. Positive for acute PE with CT evidence of right heart strain (RV/LV Ratio = 1.2) consistent with at least submassive (intermediate risk) PE. The presence of right heart strain has been associated with an increased risk of morbidity  and mortality. Critical Value/emergent results were called by telephone at the time of interpretation on 10/18/2017 at 3:44 pm to Dr. Pricilla LovelessSCOTT Nysa Sarin and Dr. Maryclare BeanArt Hoss, Who verbally acknowledged these results. 2. Gallstones. Electronically Signed   By: Signa Kellaylor  Stroud M.D.   On: 10/18/2017 15:48    Procedures .Critical Care Performed by: Pricilla LovelessGoldston, Nickayla Mcinnis, MD Authorized by: Pricilla LovelessGoldston, Xavyer Steenson, MD   Critical care provider statement:    Critical care time (minutes):  40   Critical care time was exclusive of:  Separately billable procedures and treating other patients   Critical care was necessary to treat or prevent imminent or life-threatening deterioration of the following conditions:  Circulatory failure, respiratory failure and cardiac failure   Critical care was time spent personally by me on the following activities:  Development of treatment plan with patient or surrogate, discussions with consultants, evaluation of patient's response to treatment, examination of patient, obtaining history from patient or surrogate, ordering and performing treatments and interventions, ordering and review of laboratory studies, ordering and review of radiographic studies, pulse oximetry, re-evaluation of patient's condition and review of old charts   (including critical care time)  Medications Ordered in ED Medications  heparin 100-0.45 UNIT/ML-% infusion (not administered)  heparin ADULT infusion 100 units/mL (25000 units/25750mL sodium chloride 0.45%) (1,600 Units/hr Intravenous New Bag/Given 10/18/17 1601)  aspirin chewable tablet 324 mg (324 mg Oral Given 10/18/17 1458)  iopamidol (ISOVUE-370) 76 % injection 100 mL (100 mLs Intravenous Contrast Given 10/18/17 1523)  heparin bolus via infusion 6,000 Units (6,000 Units Intravenous Bolus from Bag 10/18/17 1601)     Initial Impression / Assessment and Plan / ED Course  I have reviewed the triage vital signs and the nursing notes.  Pertinent labs & imaging results  that were available during my care of the patient were reviewed by me and considered in my medical decision making (see chart for details).     Patient's workup is consistent with large pulmonary embolus with some evidence of right heart strain.  The patient has hypertension here, and tells me he has not been on his hypertensive medicines or Xarelto for quite some time.  He has no other obvious cause of the PE besides having had prior DVTs multiple years ago.  He is not in any acute distress and is not hypoxic and does not have increased work of breathing.  However given the PE burden and low level troponin elevation, he will need admission to the stepdown unit for monitoring and treatment.  He was placed on IV  heparin.  Discussed with Dr. Jarvis Newcomer, who will admit to [facility] for admission and care.  Final Clinical Impressions(s) / ED Diagnoses   Final diagnoses:  Acute saddle pulmonary embolism with acute cor pulmonale [facility])    ED Discharge Orders    None       Pricilla Loveless, MD 10/18/17 [phone]

## 2017-10-18 NOTE — Plan of Care (Signed)
[facility]  Patient is usually active, healthy male Hx DVT previously on xarelto, stopped for unclear reasons, also HTN not on medications who presented for progressive dyspnea on exertion. HypERtensive, otherwise vitals at rest wnl. Hypoxic to 88% on exertion. CTA chest showed acute PE with right heart strain (RV/LV ratio 1.2). Troponin 0.04. Heparin started. Asked to trend troponin, [facility].   Please Page [phone] [facility].   Hazeline Junkeryan Sael Furches, MD 10/18/2017 4:03 PM

## 2017-10-18 NOTE — Progress Notes (Signed)
ANTICOAGULATION CONSULT NOTE - Initial Consult  Pharmacy Consult for Heparin Indication: pulmonary embolus  No Known Allergies  Patient Measurements: Height: 5\' 7"  (170.2 cm) Weight: 267 lb (121.1 kg) IBW/kg (Calculated) : 66.1 Heparin Dosing Weight: 94.2kg  Vital Signs: Temp: 98.1 F (36.7 C) (01/30 1313) Temp Source: Oral (01/30 1313) BP: 177/96 (01/30 1514) Pulse Rate: 65 (01/30 1514)  Labs: Recent Labs    10/18/17 1355  HGB 13.8  HCT 40.6  PLT 124*  CREATININE 0.83  TROPONINI 0.04*    Estimated Creatinine Clearance: 120.9 mL/min (by C-G formula based on SCr of 0.83 mg/dL).  Medical History: Past Medical History:  Diagnosis Date  . DVT (deep venous thrombosis) (HCC)   . Hypertension     Medications:  Xarelto PTA 20mg  daily with supper (last dose was "a long time ago" per pt report)  Assessment: 56yom presented to ED with SOB on exertion. Elevated trop x1. Pt has hx of DVT and completed a course of Xarelto PTA "a long time ago" per pt report. D-Dimer >20. Pharmacy to dose heparin for PE confirmed via CT chest. Scr and Hgb wnl. Low Plt at 124.  Goal of Therapy:  Heparin level 0.3-0.7 units/ml Monitor platelets by anticoagulation protocol: Yes   Plan:  Give 6000 units bolus x 1 Start heparin infusion at 1600 units/hr  6 hour heparin level Daily heparin level and CBC  Richard Leon 10/18/2017,3:36 PM

## 2017-10-18 NOTE — Telephone Encounter (Signed)
Pt reporting activity and exercise induced SOB. Pt denies SOB at rest or chest pain or wheezing. Pt states SOB began Sunday during an exercise class. He states that he began having SOB and began to feel lightheaded and nauseated. Pt states it resolved with rest. Pt states now that he is having shortness of breath with minimal exertion like walking into work or walking to go to the bathroom. Pt states prior to this he was very physically active walking several miles each day and has been eating better and has lost weight. Pt has a h/ DVT and is Xarelto and states that he had similar symptoms then back in 2016. Disposition recommended UCC or PCP triage. [facility]. Pt states that he will go as advised. Reason for Disposition . [1] MILD difficulty breathing (e.g., minimal/no SOB at rest, SOB with walking, pulse <100) AND [2] NEW-onset or WORSE than normal    Has h/o DVT Pt is on Xarelto  Answer Assessment - Initial Assessment Questions 1. RESPIRATORY STATUS: "Describe your breathing?" (e.g., wheezing, shortness of breath, unable to speak, severe coughing)      Shortness of breath 2. ONSET: "When did this breathing problem begin?"      10/15/17 3. PATTERN "Does the difficult breathing come and go, or has it been constant since it started?"      Comes and goes 4. SEVERITY: "How bad is your breathing?" (e.g., mild, moderate, severe)    - MILD: No SOB at rest, mild SOB with walking, speaks normally in sentences, can lay down, no retractions, pulse < 100.    - MODERATE: SOB at rest, SOB with minimal exertion and prefers to sit, cannot lie down flat, speaks in phrases, mild retractions, audible wheezing, pulse 100-120.    - SEVERE: Very SOB at rest, speaks in single words, struggling to breathe, sitting hunched forward, retractions, pulse > 120      With walking SOB is "extreme" denies SOB with sitting. Pt during NT call is  talking in full sentences. No wheezing. Does not know how to check pulse 5. RECURRENT SYMPTOM: "Have you had difficulty breathing before?" If so, ask: "When was the last time?" and "What happened that time?"      Had SOB with with right leg DVT and pneumonia. 2016 6. CARDIAC HISTORY: "Do you have any history of heart disease?" (e.g., heart attack, angina, bypass surgery, angioplasty)      no 7. LUNG HISTORY: "Do you have any history of lung disease?"  (e.g., pulmonary embolus, asthma, emphysema)     no 8. CAUSE: "What do you think is causing the breathing problem?"      Pt does not know.  9. OTHER SYMPTOMS: "Do you have any other symptoms? (e.g., dizziness, runny nose, cough, chest pain, fever)     None right now. Pt does have nausea when he is SOB and feels like might pass out 10. PREGNANCY: "Is there any chance you are pregnant?" "When was your last menstrual period?"       n/a 11. TRAVEL: "Have you traveled out of the country in the last month?" (e.g., travel history, exposures)       no  Protocols used: BREATHING DIFFICULTY-A-AH

## 2017-10-18 NOTE — ED Triage Notes (Signed)
Sob since Sunday.  Increased on exertion.  No chest pain or leg pain,  No cold symptoms.

## 2017-10-18 NOTE — H&P (Addendum)
History and Physical    [name]:[id number] DOB: 07-27-1961 DOA: 10/18/2017  PCP: Fleet Contras, MD  Patient coming from: Home.  Chief Complaint: Shortness of breath.  HPI: Richard Leon is a 57 y.o. male with history of previous recurrent DVT hypertension has not been on no medications for last year presents to the ER with complaints of increasing shortness of breath.  Patient has been having increasing shortness of breath last 2-3 days.  Denies any chest pain fever or chills.  Shortness of breath is mostly exertional.  ED Course: In the ER patient's d-dimer was markedly elevated CT angiogram of the chest done shows pulmonary embolism with strain pattern.  Troponin was mildly elevated BNP was normal.  Patient was not in acute distress.  Patient blood pressure is elevated and has not been taking his antihypertensives since last year.  Patient admitted for further management of acute pulmonary embolism and was started on heparin infusion.  Review of Systems: As per HPI, rest all negative.   Past Medical History:  Diagnosis Date  . DVT (deep venous thrombosis) (HCC)   . Hypertension     Past Surgical History:  Procedure Laterality Date  . None       reports that  has never smoked. he has never used smokeless tobacco. He reports that he does not drink alcohol or use drugs.  No Known Allergies  Family History  Problem Relation Age of Onset  . Arthritis Mother   . Hypertension Mother   . COPD Mother   . Kidney disease Sister   . Cancer Brother     Prior to Admission medications   Medication Sig Start Date End Date Taking? Authorizing Provider  amLODipine (NORVASC) 5 MG tablet Take 1 tablet (5 mg total) by mouth daily. 11/08/16   Wallis Bamberg, PA-C  hydrochlorothiazide (HYDRODIURIL) 12.5 MG tablet Take 1 tablet (12.5 mg total) by mouth daily. 11/08/16   Wallis Bamberg, PA-C  rivaroxaban (XARELTO) 20 MG TABS tablet Take 1 tablet (20 mg total) by mouth daily with  supper. 12/08/16   Wallis Bamberg, PA-C    Physical Exam: Vitals:   10/18/17 1858 10/18/17 1900 10/18/17 1955 10/18/17 2053  BP: (!) 167/109 (!) 185/97 (!) 185/111 (!) 197/109  Pulse: (!) 59 71 (!) 58   Resp: 15 (!) 23 14   Temp:      TempSrc:      SpO2: 97% 95% 99%   Weight:      Height:          Constitutional: Moderately built and nourished. Vitals:   10/18/17 1858 10/18/17 1900 10/18/17 1955 10/18/17 2053  BP: (!) 167/109 (!) 185/97 (!) 185/111 (!) 197/109  Pulse: (!) 59 71 (!) 58   Resp: 15 (!) 23 14   Temp:      TempSrc:      SpO2: 97% 95% 99%   Weight:      Height:       Eyes: Anicteric no pallor. ENMT: No discharge from the ears eyes nose or mouth. Neck: No mass felt.  No neck rigidity.  No JVD appreciated. Respiratory: No rhonchi or crepitations. Cardiovascular: S1-S2 heard no murmurs appreciated. Abdomen: Soft nontender bowel sounds present. Musculoskeletal: No edema.  No joint effusion. Skin: No rash.  Skin appears warm. Neurologic: Alert awake oriented to time place and person.  Moves all extremities. Psychiatric: Appears normal.  Normal affect.   Labs on Admission: I have personally reviewed following labs and imaging studies  CBC: Recent Labs  Lab 10/18/17 1355  WBC 7.3  NEUTROABS 4.4  HGB 13.8  HCT 40.6  MCV 82.0  PLT 124*   Basic Metabolic Panel: Recent Labs  Lab 10/18/17 1355  NA 138  K 3.5  CL 106  CO2 23  GLUCOSE 84  BUN 13  CREATININE 0.83  CALCIUM 9.4   GFR: Estimated Creatinine Clearance: 120.9 mL/min (by C-G formula based on SCr of 0.83 mg/dL). Liver Function Tests: Recent Labs  Lab 10/18/17 1355  AST 22  ALT 15*  ALKPHOS 74  BILITOT 1.2  PROT 7.4  ALBUMIN 3.6   No results for input(s): LIPASE, AMYLASE in the last 168 hours. No results for input(s): AMMONIA in the last 168 hours. Coagulation Profile: No results for input(s): INR, PROTIME in the last 168 hours. Cardiac Enzymes: Recent Labs  Lab 10/18/17 1355    TROPONINI 0.04*   BNP (last 3 results) No results for input(s): PROBNP in the last 8760 hours. HbA1C: No results for input(s): HGBA1C in the last 72 hours. CBG: No results for input(s): GLUCAP in the last 168 hours. Lipid Profile: No results for input(s): CHOL, HDL, LDLCALC, TRIG, CHOLHDL, LDLDIRECT in the last 72 hours. Thyroid Function Tests: No results for input(s): TSH, T4TOTAL, FREET4, T3FREE, THYROIDAB in the last 72 hours. Anemia Panel: No results for input(s): VITAMINB12, FOLATE, FERRITIN, TIBC, IRON, RETICCTPCT in the last 72 hours. Urine analysis:    Component Value Date/Time   COLORURINE YELLOW 01/23/2014 1345   APPEARANCEUR CLEAR 01/23/2014 1345   LABSPEC 1.015 01/23/2014 1345   PHURINE 5.5 01/23/2014 1345   GLUCOSEU NEGATIVE 01/23/2014 1345   HGBUR NEGATIVE 01/23/2014 1345   BILIRUBINUR NEGATIVE 01/23/2014 1345   KETONESUR NEGATIVE 01/23/2014 1345   PROTEINUR NEGATIVE 01/23/2014 1345   UROBILINOGEN 1.0 01/23/2014 1345   NITRITE NEGATIVE 01/23/2014 1345   LEUKOCYTESUR NEGATIVE 01/23/2014 1345   Sepsis Labs: @LABRCNTIP (procalcitonin:4,lacticidven:4) )No results found for this or any previous visit (from the past 240 hour(s)).   Radiological Exams on Admission: Dg Chest 2 View  Result Date: 10/18/2017 CLINICAL DATA:  Shortness of breath. EXAM: CHEST  2 VIEW COMPARISON:  11/08/2016 FINDINGS: The heart size and mediastinal contours are within normal limits. Pulmonary vascular congestion is again noted. Prominence of the pulmonary arteries identified which may reflect PA hypertension. Both lungs are clear. The visualized skeletal structures are unremarkable. IMPRESSION: 1. Pulmonary vascular congestion. Electronically Signed   By: Signa Kellaylor  Stroud M.D.   On: 10/18/2017 14:13   Ct Angio Chest Pe W/cm &/or Wo Cm  Result Date: 10/18/2017 CLINICAL DATA:  PE suspected intermediate probability. EXAM: CT ANGIOGRAPHY CHEST WITH CONTRAST TECHNIQUE: Multidetector CT imaging of  the chest was performed using the standard protocol during bolus administration of intravenous contrast. Multiplanar CT image reconstructions and MIPs were obtained to evaluate the vascular anatomy. CONTRAST:  100mL ISOVUE-370 IOPAMIDOL (ISOVUE-370) INJECTION 76% COMPARISON:  None. FINDINGS: Cardiovascular: Normal heart size. No pericardial effusion. Large clot burden is identified within both central right and left pulmonary arteries as well as the lobar and segmental pulmonary arteries of both lower lobes, and right upper lobe. Left upper lobe segmental pulmonary emboli also noted. The RV to LV ratio is equal to 1.2. Mediastinum/Nodes: The trachea appears patent and is midline. Normal appearance of the esophagus. No axillary or supraclavicular adenopathy. No mediastinal or hilar adenopathy. Lungs/Pleura: No pleural effusion. No airspace consolidation or atelectasis. Bandlike area of ground-glass attenuation within the left upper lobe is identified which may be the  sequelae of pulmonary infarct. Upper Abdomen: No acute abnormality.  Gallstones noted. Musculoskeletal: Degenerative disc disease noted within the thoracic spine. No aggressive lytic or sclerotic bone lesions identified. Review of the MIP images confirms the above findings. IMPRESSION: 1. Positive for acute PE with CT evidence of right heart strain (RV/LV Ratio = 1.2) consistent with at least submassive (intermediate risk) PE. The presence of right heart strain has been associated with an increased risk of morbidity and mortality. Critical Value/emergent results were called by telephone at the time of interpretation on 10/18/2017 at 3:44 pm to Dr. Pricilla Loveless and Dr. Maryclare Bean, Who verbally acknowledged these results. 2. Gallstones. Electronically Signed   By: Signa Kell M.D.   On: 10/18/2017 15:48    EKG: Independently reviewed.  Normal sinus rhythm with nonspecific ST changes.  Assessment/Plan Principal Problem:   Pulmonary emboli  (HCC) Active Problems:   HTN (hypertension)   Pulmonary embolism (HCC)    1. Acute pulmonary embolism with strain pattern presently hemodynamically stable unprovoked -patient has been started on heparin infusion.  If patient continues to remain stable change to oral anticoagulation.  We will cycle cardiac markers check 2D echo.  Discussed with pulmonary critical care at this time they recommended continuing heparin.  Patient probably will need lifelong anticoagulation since patient has had recurrent DVTs previously. 2. Hypertensive urgency -for now I have kept patient on PRN IV hydralazine and closely follow blood pressure trends.  Patient blood pressure tends to remain high will need to restart patient's antihypertensives.  Patient used to be on amlodipine and hydrochlorothiazide last year.   DVT prophylaxis: Heparin. Code Status: Full code. Family Communication: Discussed with patient. Disposition Plan: Home. Consults called: Discussed with pulmonary critical care. Admission status: Inpatient.   Eduard Clos MD [facility] Pager [phone].  If 7PM-7AM, please contact night-coverage [email or web address] Password [email or web address]  10/18/2017, 9:35 PM

## 2017-10-19 ENCOUNTER — Inpatient Hospital Stay (HOSPITAL_COMMUNITY): Payer: 59

## 2017-10-19 DIAGNOSIS — I2699 Other pulmonary embolism without acute cor pulmonale: Secondary | ICD-10-CM

## 2017-10-19 LAB — BASIC METABOLIC PANEL
Anion gap: 6 (ref 5–15)
BUN: 12 mg/dL (ref 6–20)
CHLORIDE: 109 mmol/L (ref 101–111)
CO2: 23 mmol/L (ref 22–32)
CREATININE: 0.75 mg/dL (ref 0.61–1.24)
Calcium: 8.7 mg/dL — ABNORMAL LOW (ref 8.9–10.3)
GFR calc Af Amer: >60 mL/min (ref >60)
GFR calc non Af Amer: >60 mL/min (ref >60)
GLUCOSE: 107 mg/dL — AB (ref 65–99)
Potassium: 3.4 mmol/L — ABNORMAL LOW (ref 3.5–5.1)
Sodium: 138 mmol/L (ref 135–145)

## 2017-10-19 LAB — HEPARIN LEVEL (UNFRACTIONATED): Heparin Unfractionated: 0.57 IU/mL (ref 0.30–0.70)

## 2017-10-19 LAB — CBC
HCT: 39.6 % (ref 39.0–52.0)
Hemoglobin: 13.6 g/dL (ref 13.0–17.0)
MCH: 28.2 pg (ref 26.0–34.0)
MCHC: 34.3 g/dL (ref 30.0–36.0)
MCV: 82.2 fL (ref 78.0–100.0)
PLATELETS: 135 10*3/uL — AB (ref 150–400)
RBC: 4.82 MIL/uL (ref 4.22–5.81)
RDW: 15.3 % (ref 11.5–15.5)
WBC: 7.5 10*3/uL (ref 4.0–10.5)

## 2017-10-19 LAB — TROPONIN I
Troponin I: 0.05 ng/mL (ref <0.03)
Troponin I: 0.06 ng/mL (ref <0.03)

## 2017-10-19 LAB — HIV ANTIBODY (ROUTINE TESTING W REFLEX): HIV Screen 4th Generation wRfx: NONREACTIVE

## 2017-10-19 MED ORDER — POTASSIUM CHLORIDE 10 MEQ/100ML IV SOLN
10.0000 meq | INTRAVENOUS | Status: AC
  Administered 2017-10-19 (×2): 10 meq via INTRAVENOUS
  Filled 2017-10-19: qty 100

## 2017-10-19 MED ORDER — FUROSEMIDE 10 MG/ML IJ SOLN
20.0000 mg | Freq: Every day | INTRAMUSCULAR | Status: DC
  Administered 2017-10-19 – 2017-10-20 (×2): 20 mg via INTRAVENOUS
  Filled 2017-10-19 (×2): qty 2

## 2017-10-19 MED ORDER — POTASSIUM CHLORIDE 10 MEQ/50ML IV SOLN: 10.0000 meq | INTRAVENOUS | Status: DC | PRN

## 2017-10-19 MED ORDER — RIVAROXABAN 15 MG PO TABS
15.0000 mg | ORAL_TABLET | Freq: Two times a day (BID) | ORAL | Status: DC
  Administered 2017-10-19 – 2017-10-20 (×3): 15 mg via ORAL
  Filled 2017-10-19 (×3): qty 1

## 2017-10-19 MED ORDER — LISINOPRIL 2.5 MG PO TABS
5.0000 mg | ORAL_TABLET | Freq: Every day | ORAL | Status: DC
  Administered 2017-10-19 – 2017-10-20 (×2): 5 mg via ORAL
  Filled 2017-10-19 (×2): qty 2

## 2017-10-19 NOTE — Progress Notes (Signed)
*  PRELIMINARY RESULTS* Vascular Ultrasound Bilateral lower extremity venous duplex has been completed.  Preliminary findings: Acute deep vein thrombosis noted in the distal femoral, popliteal and calf veins of the right lower extremity. Chronic appearing thrombosis noted in the left popliteal vein. Other visualized veins of the lower extremities appear negative for thrombus.  Somewhat limited visualization of the calf veins bilaterally secondary to patient body habitus. Negative for bakers cysts bilaterally.  Tempie DonningCharlotte C Norely Schlick 10/19/2017, 2:00 PM

## 2017-10-19 NOTE — Progress Notes (Signed)
ANTICOAGULATION CONSULT NOTE - Follow Up Consult  Pharmacy Consult for Heparin Indication: pulmonary embolus  No Known Allergies  Patient Measurements: Height: 5\' 7"  (170.2 cm) Weight: 267 lb (121.1 kg) IBW/kg (Calculated) : 66.1 Heparin Dosing Weight:   Vital Signs: Temp: 97.7 F (36.5 C) (01/31 0400) Temp Source: Oral (01/31 0400) BP: 152/90 (01/31 0400) Pulse Rate: 72 (01/31 0400)  Labs: Recent Labs    10/18/17 1355 10/18/17 2140 10/18/17 2245 10/19/17 0323  HGB 13.8  --   --  13.6  HCT 40.6  --   --  39.6  PLT 124*  --   --  135*  HEPARINUNFRC  --   --  0.63  --   CREATININE 0.83  --   --  0.75  TROPONINI 0.04* 0.03*  --   --     Estimated Creatinine Clearance: 125.4 mL/min (by C-G formula based on SCr of 0.75 mg/dL).   Medications:  Infusions:  . heparin 1,600 Units/hr (10/19/17 0445)    Assessment: Patient with first heparin level at goal.  No heparin issues per RN.  Goal of Therapy:  Heparin level 0.3-0.7 units/ml Monitor platelets by anticoagulation protocol: Yes   Plan:  Continue heparin drip at current rate Recheck level with AM labs  Darlina GuysGrimsley Jr, Jacquenette ShoneJulian Crowford 10/19/2017,5:29 AM

## 2017-10-19 NOTE — Care Management Note (Signed)
Case Management Note  Patient Details  Name: Skip EstimableStevenson L Daris MRN: [id number] Date of Birth: 10/19/1960  Subjective/Objective:                  Pulmonary embolius  Action/Plan: Date: October 19, 2017 Marcelle SmilingRhonda Delois Silvester, BSN, TeresitaRN3, [place]CCM [phone] Chart and notes review for patient progress and needs. Will follow for case management and discharge needs. No cm or discharge needs present at time of this review. Next review date: [id number] Expected Discharge Date:                  Expected Discharge Plan:  Home/Self Care  In-House Referral:     Discharge planning Services  CM Consult  Post Acute Care Choice:    Choice offered to:     DME Arranged:    DME Agency:     HH Arranged:    HH Agency:     Status of Service:  In process, will continue to follow  If discussed at Long Length of Stay Meetings, dates discussed:    Additional Comments:  Golda AcreDavis, Sully Manzi Lynn, RN 10/19/2017, 8:29 AM

## 2017-10-19 NOTE — Progress Notes (Signed)
PROGRESS NOTE    Richard Leon  MWN:[id number] DOB: 05/29/1961 DOA: 10/18/2017 PCP: Fleet ContrasAvbuere, Edwin, MD   Brief Narrative:56 y.o. male with history of previous recurrent DVT hypertension has not been on no medications for last year presents to the ER with complaints of increasing shortness of breath.  Patient has been having increasing shortness of breath last 2-3 days.  Denies any chest pain fever or chills.  Shortness of breath is mostly exertional.  ED Course: In the ER patient's d-dimer was markedly elevated CT angiogram of the chest done shows pulmonary embolism with strain pattern.  Troponin was mildly elevated BNP was normal.  Patient was not in acute distress.  Patient blood pressure is elevated and has not been taking his antihypertensives since last year.  Patient admitted for further management of acute pulmonary embolism and was started on heparin infusion.  10/19/2017 patient resting in bed awake alert.  Feels better.  Denies any chest pain at this time.  He is on oxygen at 2 L.  He does not have oxygen at home.  His family is at the bedside.  He reported that he stopped taking his medications as he was taking care of his elderly mom who passed away in July 2018 he has not had time to take care of himself for his health.  He does have a history of DVT for which she was on oral anticoagulation as an outpatient which was stopped approximately 3 years ago after discussing with a hematologist who has told him that he does not need to continue anticoagulation anymore.  Assessment & Plan:   Principal Problem:   Pulmonary emboli (HCC) Active Problems:   HTN (hypertension)   Pulmonary embolism (HCC) 1] acute PE in a patient with history of multiple DVTs.  He reported his mother had DVTs.  Patient has been started on heparin drip at the time of admission.  I will start the patient on Xarelto today.  Patient has been on Xarelto in the past and he is used to taking Xarelto.  His renal  functions are normal at this time.  I have discussed with him that he probably will need anticoagulation for the rest of his life or at least for a very long time.  I have also discussed with him to follow-up with his oncologist and hematologist once discharged to see the duration of treatment.  I have ordered a venous Doppler of his lower extremities which is pending at this time echocardiogram has been ordered.  2] history of hypertension patient was on amlodipine and hydrochlorothiazide which she apparently stopped taking at home due to excess stress he had while taking care of his mother.  I will start him on a small dose of Lasix 20 mg daily with his fluid retention in both lower extremities along with small dose of lisinopril 2.5 mg daily adjusted dose as needed to normalize his blood pressure.   DVT prophylaxis: Xarelto Code Status full code Family Communication: Discussed with wife and daughter who was in the room while I was seeing the patient. Disposition Plan: Plan to DC him in the next 24-48 hours as long as he is stable. Consultants:  None  Procedures: None Antimicrobials: None  Subjective: Feels better.  Objective: Vitals:   10/19/17 0755 10/19/17 0829 10/19/17 0832 10/19/17 0902  BP:  (!) 191/102 (!) 175/104 (!) 145/92  Pulse:  72 71 86  Resp:  13 14 17   Temp: 98.7 F (37.1 C)     TempSrc:  Oral     SpO2:  99% 100% 100%  Weight:      Height:        Intake/Output Summary (Last 24 hours) at 10/19/2017 1123 Last data filed at 10/19/2017 0445 Gross per 24 hour  Intake 203.73 ml  Output 350 ml  Net -146.27 ml   Filed Weights   10/18/17 1314  Weight: 121.1 kg (267 lb)    Examination:  General exam: Appears calm and comfortable  Respiratory system: Clear to auscultation. Respiratory effort normal. Cardiovascular system: S1 & S2 heard, RRR. No JVD, murmurs, rubs, gallops or clicks. No pedal edema. Gastrointestinal system: Abdomen is nondistended, soft and  nontender. No organomegaly or masses felt. Normal bowel sounds heard. Central nervous system: Alert and oriented. No focal neurological deficits. Extremities: Symmetric 5 x 5 power. Skin: No rashes, lesions or ulcers Psychiatry: Judgement and insight appear normal. Mood & affect appropriate.     Data Reviewed: I have personally reviewed following labs and imaging studies  CBC: Recent Labs  Lab 10/18/17 1355 10/19/17 0323  WBC 7.3 7.5  NEUTROABS 4.4  --   HGB 13.8 13.6  HCT 40.6 39.6  MCV 82.0 82.2  PLT 124* 135*   Basic Metabolic Panel: Recent Labs  Lab 10/18/17 1355 10/19/17 0323  NA 138 138  K 3.5 3.4*  CL 106 109  CO2 23 23  GLUCOSE 84 107*  BUN 13 12  CREATININE 0.83 0.75  CALCIUM 9.4 8.7*   GFR: Estimated Creatinine Clearance: 125.4 mL/min (by C-G formula based on SCr of 0.75 mg/dL). Liver Function Tests: Recent Labs  Lab 10/18/17 1355  AST 22  ALT 15*  ALKPHOS 74  BILITOT 1.2  PROT 7.4  ALBUMIN 3.6   No results for input(s): LIPASE, AMYLASE in the last 168 hours. No results for input(s): AMMONIA in the last 168 hours. Coagulation Profile: No results for input(s): INR, PROTIME in the last 168 hours. Cardiac Enzymes: Recent Labs  Lab 10/18/17 1355 10/18/17 2140 10/19/17 0323 10/19/17 0944  TROPONINI 0.04* 0.03* 0.05* 0.06*   BNP (last 3 results) No results for input(s): PROBNP in the last 8760 hours. HbA1C: No results for input(s): HGBA1C in the last 72 hours. CBG: No results for input(s): GLUCAP in the last 168 hours. Lipid Profile: No results for input(s): CHOL, HDL, LDLCALC, TRIG, CHOLHDL, LDLDIRECT in the last 72 hours. Thyroid Function Tests: No results for input(s): TSH, T4TOTAL, FREET4, T3FREE, THYROIDAB in the last 72 hours. Anemia Panel: No results for input(s): VITAMINB12, FOLATE, FERRITIN, TIBC, IRON, RETICCTPCT in the last 72 hours. Sepsis Labs: No results for input(s): PROCALCITON, LATICACIDVEN in the last 168  hours.  Recent Results (from the past 240 hour(s))  MRSA PCR Screening     Status: None   Collection Time: 10/18/17  8:17 PM  Result Value Ref Range Status   MRSA by PCR NEGATIVE NEGATIVE Final    Comment:        The GeneXpert MRSA Assay (FDA approved for NASAL specimens only), is one component of a comprehensive MRSA colonization surveillance program. It is not intended to diagnose MRSA infection nor to guide or monitor treatment for MRSA infections.          Radiology Studies: Dg Chest 2 View  Result Date: 10/18/2017 CLINICAL DATA:  Shortness of breath. EXAM: CHEST  2 VIEW COMPARISON:  11/08/2016 FINDINGS: The heart size and mediastinal contours are within normal limits. Pulmonary vascular congestion is again noted. Prominence of the pulmonary arteries identified which  may reflect PA hypertension. Both lungs are clear. The visualized skeletal structures are unremarkable. IMPRESSION: 1. Pulmonary vascular congestion. Electronically Signed   By: Signa Kell M.D.   On: 10/18/2017 14:13   Ct Angio Chest Pe W/cm &/or Wo Cm  Result Date: 10/18/2017 CLINICAL DATA:  PE suspected intermediate probability. EXAM: CT ANGIOGRAPHY CHEST WITH CONTRAST TECHNIQUE: Multidetector CT imaging of the chest was performed using the standard protocol during bolus administration of intravenous contrast. Multiplanar CT image reconstructions and MIPs were obtained to evaluate the vascular anatomy. CONTRAST:  ISOVUE-370 IOPAMIDOL (ISOVUE-370) INJECTION 76% COMPARISON:  None. FINDINGS: Cardiovascular: Normal heart size. No pericardial effusion. Large clot burden is identified within both central right and left pulmonary arteries as well as the lobar and segmental pulmonary arteries of both lower lobes, and right upper lobe. Left upper lobe segmental pulmonary emboli also noted. The RV to LV ratio is equal to 1.2. Mediastinum/Nodes: The trachea appears patent and is midline. Normal appearance of the  esophagus. No axillary or supraclavicular adenopathy. No mediastinal or hilar adenopathy. Lungs/Pleura: No pleural effusion. No airspace consolidation or atelectasis. Bandlike area of ground-glass attenuation within the left upper lobe is identified which may be the sequelae of pulmonary infarct. Upper Abdomen: No acute abnormality.  Gallstones noted. Musculoskeletal: Degenerative disc disease noted within the thoracic spine. No aggressive lytic or sclerotic bone lesions identified. Review of the MIP images confirms the above findings. IMPRESSION: 1. Positive for acute PE with CT evidence of right heart strain (RV/LV Ratio = 1.2) consistent with at least submassive (intermediate risk) PE. The presence of right heart strain has been associated with an increased risk of morbidity and mortality. Critical Value/emergent results were called by telephone at the time of interpretation on 10/18/2017 at 3:44 pm to Dr. Pricilla Loveless and Dr. Maryclare Bean, Who verbally acknowledged these results. 2. Gallstones. Electronically Signed   By: Signa Kell M.D.   On: 10/18/2017 15:48        Scheduled Meds: . Rivaroxaban  15 mg Oral BID WC   Continuous Infusions:   LOS: 1 day     Alwyn Ren, MD [facility]  If 7PM-7AM, please contact night-coverage [email or web address] Password [facility] 10/19/2017, 11:23 AM

## 2017-10-19 NOTE — Progress Notes (Signed)
Pt gave permission to ask questions on nursing admission history while his wife was in the room. Richard Leon. Richard Leon BSN, RN-BC Admissions RN 10/19/2017 3:00 PM

## 2017-10-19 NOTE — Progress Notes (Addendum)
ANTICOAGULATION CONSULT NOTE - Follow Up Consult  Pharmacy Consult for Heparin --> Xarelto Indication: pulmonary embolus  No Known Allergies  Patient Measurements: Height: 5\' 7"  (170.2 cm) Weight: 267 lb (121.1 kg) IBW/kg (Calculated) : 66.1 Heparin Dosing Weight: 94 kg  Vital Signs: Temp: 97.7 F (36.5 C) (01/31 0400) Temp Source: Oral (01/31 0400) BP: 152/90 (01/31 0400) Pulse Rate: 72 (01/31 0400)  Labs: Recent Labs    10/18/17 1355 10/18/17 2140 10/18/17 2245 10/19/17 0323 10/19/17 0505  HGB 13.8  --   --  13.6  --   HCT 40.6  --   --  39.6  --   PLT 124*  --   --  135*  --   HEPARINUNFRC  --   --  0.63  --  0.57  CREATININE 0.83  --   --  0.75  --   TROPONINI 0.04* 0.03*  --  0.05*  --     Estimated Creatinine Clearance: 125.4 mL/min (by C-G formula based on SCr of 0.75 mg/dL).   Medications:  Infusions:  . heparin 1,600 Units/hr (10/19/17 0445)    Assessment: [age] yoM admitted with acute PE. History of previous recurrent DVT, not on anticoagulation PTA.  Started on heparin infusion.  Today, 10/19/2017: - heparin level remains therapeutic with infusion at 1600 units/hr. - Hgb WNL/stable. Platelets slightly low but stable.  Goal of Therapy:  Heparin level 0.3-0.7 units/ml Monitor platelets by anticoagulation protocol: Yes   Plan:  Continue heparin at 1600 units/hr. Daily HL and CBC.  Richard Leon, Richard Leon 10/19/2017,7:35 AM   ADDENDUM: 10/19/2017 10:12 AM Discussed transitioning patient from heparin infusion to oral anticoagulant with MD. Will transition patient to Xarelto since patient has previous experience taking Xarelto.  CrCl>100 ml/min.  Plan: Stop heparin infusion. Start Xarelto 15 mg BIDWC x 21 days then transition to 20 mg daily with supper on 11/09/2017. Will provide Xarelto education prior to discharge.  Richard Leon, PharmD, BCPS Pager: [phone] 10/19/2017 10:14 AM

## 2017-10-19 NOTE — Discharge Instructions (Signed)

## 2017-10-20 ENCOUNTER — Other Ambulatory Visit (HOSPITAL_COMMUNITY): Payer: 59

## 2017-10-20 LAB — BASIC METABOLIC PANEL
Anion gap: 5 (ref 5–15)
BUN: 13 mg/dL (ref 6–20)
CO2: 24 mmol/L (ref 22–32)
Calcium: 9.2 mg/dL (ref 8.9–10.3)
Chloride: 109 mmol/L (ref 101–111)
Creatinine, Ser: 0.83 mg/dL (ref 0.61–1.24)
GFR calc Af Amer: >60 mL/min (ref >60)
GFR calc non Af Amer: >60 mL/min (ref >60)
GLUCOSE: 94 mg/dL (ref 65–99)
POTASSIUM: 4 mmol/L (ref 3.5–5.1)
Sodium: 138 mmol/L (ref 135–145)

## 2017-10-20 MED ORDER — POTASSIUM CHLORIDE ER 10 MEQ PO TBCR: 10.0000 meq | EXTENDED_RELEASE_TABLET | Freq: Every day | ORAL | 0 refills | Status: DC

## 2017-10-20 MED ORDER — FUROSEMIDE 20 MG PO TABS: 20.0000 mg | ORAL_TABLET | Freq: Every day | ORAL | 11 refills | Status: DC

## 2017-10-20 MED ORDER — LISINOPRIL 5 MG PO TABS: 5.0000 mg | ORAL_TABLET | Freq: Every day | ORAL | 1 refills | Status: DC

## 2017-10-20 MED ORDER — LISINOPRIL 5 MG PO TABS: 10.0000 mg | ORAL_TABLET | Freq: Every day | ORAL | 1 refills | Status: DC

## 2017-10-20 MED ORDER — RIVAROXABAN (XARELTO) VTE STARTER PACK (15 & 20 MG): ORAL_TABLET | ORAL | 0 refills | Status: DC

## 2017-10-20 NOTE — Discharge Summary (Signed)
Physician Discharge Summary  Richard Leon:[id number] DOB: [birth year] DOA: 10/18/2017  PCP: Fleet Contras, MD  Admit date: 10/18/2017 Discharge date: 10/20/2017  Admitted From: Home Disposition: Home  Recommendations for Outpatient Follow-up:  1. Follow up with PCP in 1-2 weeks 2. Please obtain BMP/CBC in one week  Home Health none Equipment/Devices none Discharge Condition stable CODE STATUS: Full code cardiac diet Diet recommendation  Brief/Interim Summary::56 y.o.malewithhistory of previous recurrent DVT hypertension has not been on no medications for last year presents to the ER with complaints of increasing shortness of breath. Patient has been having increasing shortness of breath last 2-3 days. Denies any chest pain fever or chills. Shortness of breath is mostly exertional.  ED Course:In the ER patient's d-dimer was markedly elevated CT angiogram of the chest done shows pulmonary embolism with strain pattern. Troponin was mildly elevated BNP was normal. Patient was not in acute distress. Patient blood pressure is elevated and has not been taking his antihypertensives since last year. Patient admitted for further management of acute pulmonary embolism and was started on heparin infusion.  10/19/2017 patient resting in bed awake alert.  Feels better.  Denies any chest pain at this time.  He is on oxygen at 2 L.  He does not have oxygen at home.  His family is at the bedside.  He reported that he stopped taking his medications as he was taking care of his elderly mom who passed away in 07/30/18he has not had time to take care of himself for his health.  He does have a history of DVT for which she was on oral anticoagulation as an outpatient which was stopped approximately 3 years ago after discussing with a hematologist who has told him that he does not need to continue anticoagulation anymore. 10/20/2017 patient ambulating in the hallway without any  complaints.  Discharge Diagnoses:  Principal Problem:   Pulmonary emboli (HCC) Active Problems:   HTN (hypertension)   Pulmonary embolism (HCC)  1] acute PE in a patient with history of multiple DVTs.  He reported his mother had DVTs.  Patient has been started on heparin drip at the time of admission.  I started  the patient on Xarelto . Patient has been on Xarelto in the past and he is used to taking Xarelto.  His renal functions are normal at this time.  I have discussed with him that he probably will need anticoagulation for the rest of his life or at least for a very long time.  I have also discussed with him to follow-up with his oncologist and hematologist once discharged to see the duration of treatment. he had a venous Doppler of his lower extremities which showedAcute deep vein thrombosis noted in the distal femoral, popliteal and calf veins of the right lower extremity. Chronic appearing thrombosis noted in the left popliteal vein. Other visualized veins of the lower extremities appear negative for thrombus.  Somewhat limited visualization of the calf veins bilaterally secondary to patient body habitus. Negative for bakers cysts bilaterally.   2] history of hypertension patient was on amlodipine and hydrochlorothiazide which she apparently stopped taking at home due to excess stress he had while taking care of his mother.  I will start him on a small dose of Lasix 20 mg daily with his fluid retention in both lower extremities along with  lisinopril daily.  I have discussed with him that he needs to follow-up with his primary care physician on a regular basis to adjust  his blood pressure medications as well as to keep an eye on his renal functions and electrolytes.   Discharge Instructions  Discharge Instructions    Call MD for:  difficulty breathing, headache or visual disturbances   Complete by:  As directed    Call MD for:  persistant nausea and vomiting   Complete by:  As directed     Call MD for:  severe uncontrolled pain   Complete by:  As directed    Diet - low sodium heart healthy   Complete by:  As directed    Increase activity slowly   Complete by:  As directed      Allergies as of 10/20/2017   No Known Allergies     Medication List    STOP taking these medications   amLODipine 5 MG tablet Commonly known as:  NORVASC   hydrochlorothiazide 12.5 MG tablet Commonly known as:  HYDRODIURIL   rivaroxaban 20 MG Tabs tablet Commonly known as:  XARELTO Replaced by:  Rivaroxaban 15 & 20 MG Tbpk     TAKE these medications   furosemide 20 MG tablet Commonly known as:  LASIX Take 1 tablet (20 mg total) by mouth daily.   lisinopril 5 MG tablet Commonly known as:  PRINIVIL,ZESTRIL Take 2 tablets (10 mg total) by mouth daily. Start taking on:  10/21/2017   potassium chloride 10 MEQ tablet Commonly known as:  K-DUR Take 1 tablet (10 mEq total) by mouth daily.   Rivaroxaban 15 & 20 MG Tbpk Take as directed on package: Start with one 15mg  tablet by mouth twice a day with food. On Day 22, switch to one 20mg  tablet once a day with food. Replaces:  rivaroxaban 20 MG Tabs tablet      Follow-up Information    Fleet Contras, MD Follow up.   Specialty:  Internal Medicine Contact information: [address] [place] [address] [phone]          No Known Allergies  Consultations:  None   Procedures/Studies: Dg Chest 2 View  Result Date: 10/18/2017 CLINICAL DATA:  Shortness of breath. EXAM: CHEST  2 VIEW COMPARISON:  11/08/2016 FINDINGS: The heart size and mediastinal contours are within normal limits. Pulmonary vascular congestion is again noted. Prominence of the pulmonary arteries identified which may reflect PA hypertension. Both lungs are clear. The visualized skeletal structures are unremarkable. IMPRESSION: 1. Pulmonary vascular congestion. Electronically Signed   By: Signa Kell M.D.   On: 10/18/2017 14:13   Ct Angio Chest Pe  W/cm &/or Wo Cm  Result Date: 10/18/2017 CLINICAL DATA:  PE suspected intermediate probability. EXAM: CT ANGIOGRAPHY CHEST WITH CONTRAST TECHNIQUE: Multidetector CT imaging of the chest was performed using the standard protocol during bolus administration of intravenous contrast. Multiplanar CT image reconstructions and MIPs were obtained to evaluate the vascular anatomy. CONTRAST:  ISOVUE-370 IOPAMIDOL (ISOVUE-370) INJECTION 76% COMPARISON:  None. FINDINGS: Cardiovascular: Normal heart size. No pericardial effusion. Large clot burden is identified within both central right and left pulmonary arteries as well as the lobar and segmental pulmonary arteries of both lower lobes, and right upper lobe. Left upper lobe segmental pulmonary emboli also noted. The RV to LV ratio is equal to 1.2. Mediastinum/Nodes: The trachea appears patent and is midline. Normal appearance of the esophagus. No axillary or supraclavicular adenopathy. No mediastinal or hilar adenopathy. Lungs/Pleura: No pleural effusion. No airspace consolidation or atelectasis. Bandlike area of ground-glass attenuation within the left upper lobe is identified which may be the sequelae  of pulmonary infarct. Upper Abdomen: No acute abnormality.  Gallstones noted. Musculoskeletal: Degenerative disc disease noted within the thoracic spine. No aggressive lytic or sclerotic bone lesions identified. Review of the MIP images confirms the above findings. IMPRESSION: 1. Positive for acute PE with CT evidence of right heart strain (RV/LV Ratio = 1.2) consistent with at least submassive (intermediate risk) PE. The presence of right heart strain has been associated with an increased risk of morbidity and mortality. Critical Value/emergent results were called by telephone at the time of interpretation on 10/18/2017 at 3:44 pm to Dr. Pricilla Loveless and Dr. Maryclare Bean, Who verbally acknowledged these results. 2. Gallstones. Electronically Signed   By: Signa Kell  M.D.   On: 10/18/2017 15:48    (Echo, Carotid, EGD, Colonoscopy, ERCP)    Subjective:   Discharge Exam: Vitals:   10/20/17 0800 10/20/17 0930  BP: (!) 163/88   Pulse: 67 88  Resp: 20 (!) 21  Temp: 98.5 F (36.9 C)   SpO2: 100% 93%   Vitals:   10/20/17 0327 10/20/17 0400 10/20/17 0800 10/20/17 0930  BP:  (!) 147/89 (!) 163/88   Pulse:  68 67 88  Resp:  (!) 27 20 (!) 21  Temp: 97.9 F (36.6 C)  98.5 F (36.9 C)   TempSrc: Oral  Oral   SpO2:  97% 100% 93%  Weight:      Height:        General: Pt is alert, awake, not in acute distress Cardiovascular: RRR, S1/S2 +, no rubs, no gallops Respiratory: CTA bilaterally, no wheezing, no rhonchi Abdominal: Soft, NT, ND, bowel sounds + Extremities: no edema, no cyanosis    The results of significant diagnostics from this hospitalization (including imaging, microbiology, ancillary and laboratory) are listed below for reference.     Microbiology: Recent Results (from the past 240 hour(s))  MRSA PCR Screening     Status: None   Collection Time: 10/18/17  8:17 PM  Result Value Ref Range Status   MRSA by PCR NEGATIVE NEGATIVE Final    Comment:        The GeneXpert MRSA Assay (FDA approved for NASAL specimens only), is one component of a comprehensive MRSA colonization surveillance program. It is not intended to diagnose MRSA infection nor to guide or monitor treatment for MRSA infections.      Labs: BNP (last 3 results) Recent Labs    11/08/16 0905 10/18/17 1355  BNP 9.1 50.5   Basic Metabolic Panel: Recent Labs  Lab 10/18/17 1355 10/19/17 0323 10/20/17 0322  NA 138 138 138  K 3.5 3.4* 4.0  CL 106 109 109  CO2 23 23 24   GLUCOSE 84 107* 94  BUN 13 12 13   CREATININE 0.83 0.75 0.83  CALCIUM 9.4 8.7* 9.2   Liver Function Tests: Recent Labs  Lab 10/18/17 1355  AST 22  ALT 15*  ALKPHOS 74  BILITOT 1.2  PROT 7.4  ALBUMIN 3.6   No results for input(s): LIPASE, AMYLASE in the last 168 hours. No  results for input(s): AMMONIA in the last 168 hours. CBC: Recent Labs  Lab 10/18/17 1355 10/19/17 0323  WBC 7.3 7.5  NEUTROABS 4.4  --   HGB 13.8 13.6  HCT 40.6 39.6  MCV 82.0 82.2  PLT 124* 135*   Cardiac Enzymes: Recent Labs  Lab 10/18/17 1355 10/18/17 2140 10/19/17 0323 10/19/17 0944  TROPONINI 0.04* 0.03* 0.05* 0.06*   BNP: Invalid input(s): POCBNP CBG: No results for input(s): GLUCAP in the  last 168 hours. D-Dimer Recent Labs    10/18/17 1355  DDIMER >20.00*   Hgb A1c No results for input(s): HGBA1C in the last 72 hours. Lipid Profile No results for input(s): CHOL, HDL, LDLCALC, TRIG, CHOLHDL, LDLDIRECT in the last 72 hours. Thyroid function studies No results for input(s): TSH, T4TOTAL, T3FREE, THYROIDAB in the last 72 hours.  Invalid input(s): FREET3 Anemia work up No results for input(s): VITAMINB12, FOLATE, FERRITIN, TIBC, IRON, RETICCTPCT in the last 72 hours. Urinalysis    Component Value Date/Time   COLORURINE YELLOW 01/23/2014 1345   APPEARANCEUR CLEAR 01/23/2014 1345   LABSPEC 1.015 01/23/2014 1345   PHURINE 5.5 01/23/2014 1345   GLUCOSEU NEGATIVE 01/23/2014 1345   HGBUR NEGATIVE 01/23/2014 1345   BILIRUBINUR NEGATIVE 01/23/2014 1345   KETONESUR NEGATIVE 01/23/2014 1345   PROTEINUR NEGATIVE 01/23/2014 1345   UROBILINOGEN 1.0 01/23/2014 1345   NITRITE NEGATIVE 01/23/2014 1345   LEUKOCYTESUR NEGATIVE 01/23/2014 1345   Sepsis Labs Invalid input(s): PROCALCITONIN,  WBC,  LACTICIDVEN Microbiology Recent Results (from the past 240 hour(s))  MRSA PCR Screening     Status: None   Collection Time: 10/18/17  8:17 PM  Result Value Ref Range Status   MRSA by PCR NEGATIVE NEGATIVE Final    Comment:        The GeneXpert MRSA Assay (FDA approved for NASAL specimens only), is one component of a comprehensive MRSA colonization surveillance program. It is not intended to diagnose MRSA infection nor to guide or monitor treatment for MRSA  infections.      Time coordinating discharge: Over 30 minutes  SIGNED:   Alwyn RenElizabeth G Joylynn Defrancesco, MD  [facility] 10/20/2017, 11:31 AM Pager   If 7PM-7AM, please contact night-coverage [email or web address] Password [email or web address]

## 2017-10-20 NOTE — Progress Notes (Signed)
Discharge instructions given to patient, all questions answered at this time.  Prescription given to patient.  Pt. VSS with no s/s of distress noted.  Patient stable for discharge.

## 2017-10-20 NOTE — Progress Notes (Signed)
Date: October 20, 2017 Chart review for discharge needs:  None found for case management. Patient has no questions concerning post hospital care. Marcelle Smilinghonda Tesla Bochicchio, BSN, [place]RN3, [place]CCM [phone]

## 2017-10-20 NOTE — Progress Notes (Addendum)
SATURATION QUALIFICATIONS: (This note is used to comply with regulatory documentation for home oxygen)  Patient Saturations on Room Air at Rest = 100%  Patient Saturations on Room Air while Ambulating = 93%  Patient Saturations on 0 Liters of oxygen while Ambulating = 93%  Please briefly explain why patient needs home oxygen: patient stable without oxygen

## 2017-11-21 DIAGNOSIS — Z13220 Encounter for screening for lipoid disorders: Secondary | ICD-10-CM | POA: Diagnosis not present

## 2017-11-21 DIAGNOSIS — Z131 Encounter for screening for diabetes mellitus: Secondary | ICD-10-CM | POA: Diagnosis not present

## 2017-11-21 DIAGNOSIS — I10 Essential (primary) hypertension: Secondary | ICD-10-CM | POA: Diagnosis not present

## 2017-11-21 DIAGNOSIS — Z125 Encounter for screening for malignant neoplasm of prostate: Secondary | ICD-10-CM | POA: Diagnosis not present

## 2017-11-21 DIAGNOSIS — I2699 Other pulmonary embolism without acute cor pulmonale: Secondary | ICD-10-CM | POA: Diagnosis not present

## 2017-12-21 DIAGNOSIS — I10 Essential (primary) hypertension: Secondary | ICD-10-CM | POA: Diagnosis not present

## 2017-12-21 DIAGNOSIS — J302 Other seasonal allergic rhinitis: Secondary | ICD-10-CM | POA: Diagnosis not present

## 2017-12-21 DIAGNOSIS — I2699 Other pulmonary embolism without acute cor pulmonale: Secondary | ICD-10-CM | POA: Diagnosis not present

## 2018-02-01 DIAGNOSIS — I2699 Other pulmonary embolism without acute cor pulmonale: Secondary | ICD-10-CM | POA: Diagnosis not present

## 2018-02-01 DIAGNOSIS — I10 Essential (primary) hypertension: Secondary | ICD-10-CM | POA: Diagnosis not present

## 2018-02-01 DIAGNOSIS — M179 Osteoarthritis of knee, unspecified: Secondary | ICD-10-CM | POA: Diagnosis not present

## 2018-06-21 DIAGNOSIS — Z131 Encounter for screening for diabetes mellitus: Secondary | ICD-10-CM | POA: Diagnosis not present

## 2018-06-21 DIAGNOSIS — Z0000 Encounter for general adult medical examination without abnormal findings: Secondary | ICD-10-CM | POA: Diagnosis not present

## 2018-06-21 DIAGNOSIS — Z136 Encounter for screening for cardiovascular disorders: Secondary | ICD-10-CM | POA: Diagnosis not present

## 2018-07-06 DIAGNOSIS — R609 Edema, unspecified: Secondary | ICD-10-CM | POA: Diagnosis not present

## 2018-07-20 DIAGNOSIS — I10 Essential (primary) hypertension: Secondary | ICD-10-CM | POA: Diagnosis not present

## 2019-12-10 IMAGING — DX DG CHEST 2V
2 series · 2 of 2 positions shown · non-contrast
Comparison: 11/08/2016

CLINICAL DATA: Shortness of breath.

EXAM:
CHEST  2 VIEW

[chest pa]
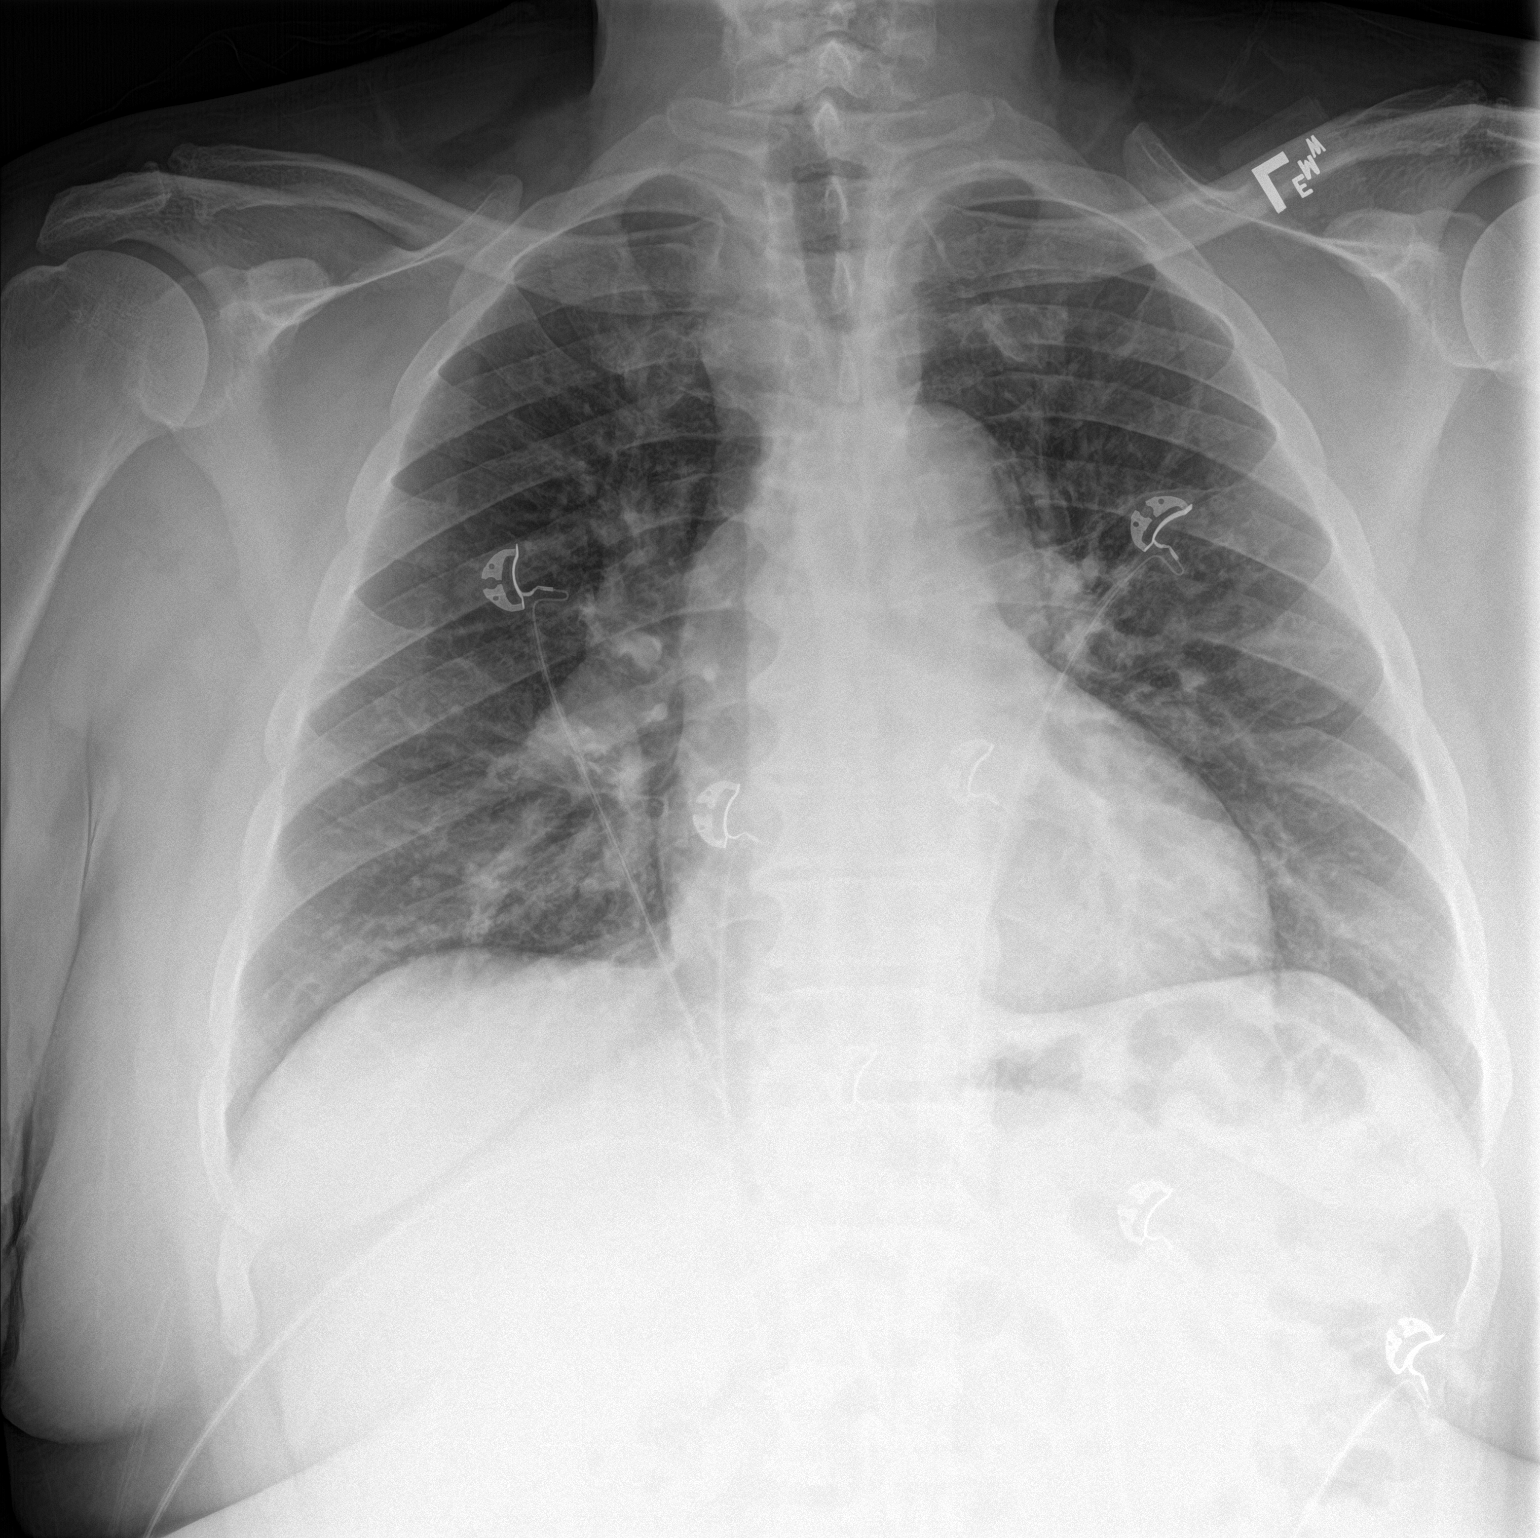

[chest lat]
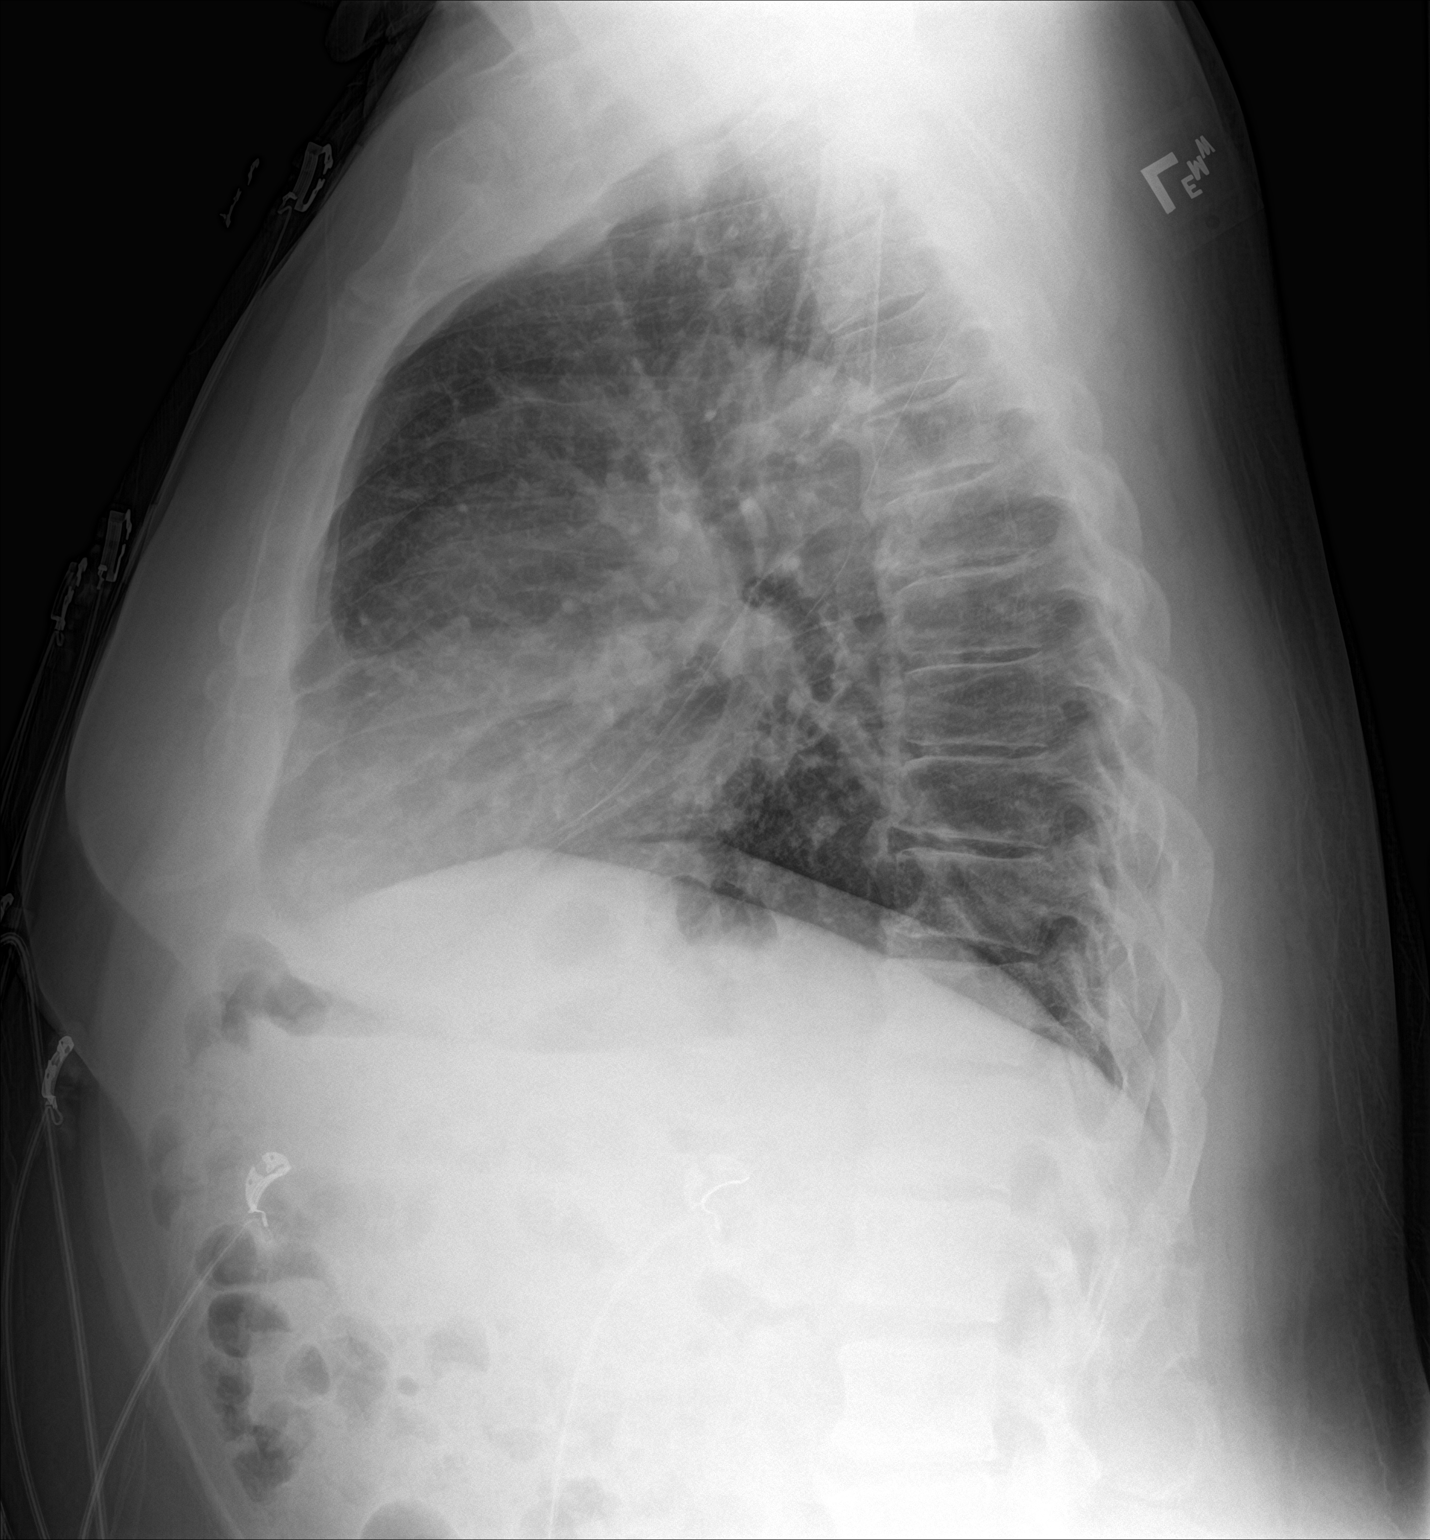

[2 of 2 positions shown; findings below may reference images not displayed]

FINDINGS: The heart size and mediastinal contours are within normal limits.
Pulmonary vascular congestion is again noted. Prominence of the
pulmonary arteries identified which may reflect PA hypertension.
Both lungs are clear. The visualized skeletal structures are
unremarkable.
IMPRESSION: 1. Pulmonary vascular congestion.

## 2020-01-19 DIAGNOSIS — N5203 Combined arterial insufficiency and corporo-venous occlusive erectile dysfunction: Secondary | ICD-10-CM | POA: Insufficient documentation

## 2020-01-20 NOTE — Progress Notes (Addendum)
 Richard Leon came for a follow up today. He is 59 years old and his wife is 45. They live in [place]. I have seen him on 01/13/20 which the evaluation and plan were:   This is a 59 years old gentleman (his wife is 71) with history of 2.5 years ED. His ED is likely because of HTN and Obesity. We reviewed the ED treatment options and he is interested on penile injection. After an extensive discussion, he agreed to the following plan.   1) PSA 2)Try TRIMIX [facility] 3) RPV next week to start self injection   Results for MICKEAL, DAWS (MRN [id number]) as of 01/20/2020 01:49  Ref. Range 01/13/2020 11:20  PROSTATE SPECIFIC AG Latest Ref Range: 0.00 - 4.00 NG/ML 0.88    Updated Evaluation (ED due to HTN and Obesity): We reviewed and discuss his PSA results, He brought all the medication, syringes and needles. We went through the instruction and the injection was performed smoothly. After watching him for 20 minutes and checking the injection site and erection level, he agreed to the following plan.  Updated Plan: 1) 0.1 m TRIMIX option 2 via [facility] (he may go to lower dose) 2) Follow up with his PCP for HTN (BP was 145/95 today) 2) RPV in 3 months   If you experience an erection lasting more than an hour, you may apply an ice pack. If your erection does not go away within the next hour, go to ED immediatly. Erections that last more than 6 hours can cause serious damage to the penile tissue   I have personally spent 40 minutes involved in face-to-face and non-face-to-face activities for this patient on the day of the visit. Professional time spent includes the following activities, in addition to those noted in the above documentation.    Electronically signed by: Ali Kathline Server, MD 01/23/20 1827    Electronically signed by: Ali Kathline Server, MD 01/23/20 [phone]

## 2021-03-03 ENCOUNTER — Other Ambulatory Visit: Payer: Self-pay | Admitting: *Deleted

## 2021-03-03 DIAGNOSIS — M7989 Other specified soft tissue disorders: Secondary | ICD-10-CM

## 2021-03-03 DIAGNOSIS — I82532 Chronic embolism and thrombosis of left popliteal vein: Secondary | ICD-10-CM

## 2021-03-26 ENCOUNTER — Ambulatory Visit (HOSPITAL_COMMUNITY)
Admission: RE | Admit: 2021-03-26 | Discharge: 2021-03-26 | Disposition: A | Discharge status: Home / Self Care | Payer: Managed Care, Other (non HMO) | Source: Ambulatory Visit | Attending: Vascular Surgery | Admitting: Vascular Surgery

## 2021-03-26 ENCOUNTER — Other Ambulatory Visit: Payer: Self-pay

## 2021-03-26 ENCOUNTER — Encounter: Payer: Self-pay | Admitting: Physician Assistant

## 2021-03-26 ENCOUNTER — Ambulatory Visit: Payer: Managed Care, Other (non HMO) | Admitting: Physician Assistant

## 2021-03-26 VITALS — BP 124/85 | HR 63 | Temp 97.7°F | Resp 18 | Ht 67.0 in | Wt 250.0 lb

## 2021-03-26 DIAGNOSIS — M7989 Other specified soft tissue disorders: Secondary | ICD-10-CM

## 2021-03-26 DIAGNOSIS — I82503 Chronic embolism and thrombosis of unspecified deep veins of lower extremity, bilateral: Secondary | ICD-10-CM | POA: Diagnosis not present

## 2021-03-26 DIAGNOSIS — I872 Venous insufficiency (chronic) (peripheral): Secondary | ICD-10-CM | POA: Diagnosis not present

## 2021-03-26 DIAGNOSIS — I82532 Chronic embolism and thrombosis of left popliteal vein: Secondary | ICD-10-CM | POA: Diagnosis not present

## 2021-03-26 NOTE — Progress Notes (Signed)
Requested by:  Fleet Contras, MD [address] [place],  [place] [address]  Reason for consultation: lymphedema with chronic DVT   History of Present Illness   Richard Leon is a 60 y.o. ([birth year]) male who presents for evaluation of lower extremity swelling. He does not recall specifics of his DVTs but that he has had multiple and was treated with Xarelto . He is no longer taking Xarelto. He reports that he saw a specialist in [facility] for evaluation for a clotting disorder because of family history in his mother but they did not find anything wrong so he was not continued on Xarelto. On reviewing his chart and non invasive studies he had DVT in RLE in 2019 involving the FV, popliteal, PT and peroneal veins. He additionally has hx of DVT in the LLE in 2014 involving the popliteal, profunda and CFV. He says that his left leg is really the only one that causes him any swelling. He does elevate some with minimal improvement. He did for a while wear compression stockings but he stopped wearing them after a couple months as he saw no improvement. He has lost approximately 150 lbs since 2018. He worksout daily. He does work from home for [employer] so he does a lot of sitting but he has a watch that vibrates to tell him to get up and move. He denies any pain in his legs, ulcer, tenderness, bleeding, heaviness, or tiredness. He has no family history of venous disease. He does have family history of DVT in mother  Venous symptoms include: swelling Onset/duration:  2014 Occupation:   Aggravating factors: sitting, standing Alleviating factors: elevation Compression:  knee high Helps:  no Pain medications:  none Previous vein procedures:  none History of DVT:  yes BLE  Past Medical History:  Diagnosis Date   DVT (deep venous thrombosis) (HCC)    Hypertension     Past Surgical History:  Procedure Laterality Date   None      Social History   Socioeconomic History   Marital status:  Married    Spouse name: Not on file   Number of children: 3   Years of education: Not on file   Highest education level: Not on file  Occupational History    Employer: [employer]  Tobacco Use   Smoking status: Never   Smokeless tobacco: Never  Substance and Sexual Activity   Alcohol use: No    Alcohol/week: 0.0 standard drinks   Drug use: No   Sexual activity: Yes    Partners: Female    Comment: Wife is the only sexual partner  Other Topics Concern   Not on file  Social History Narrative   Lives with wife.     Social Determinants of Health   Financial Resource Strain: Not on file  Food Insecurity: Not on file  Transportation Needs: Not on file  Physical Activity: Not on file  Stress: Not on file  Social Connections: Not on file  Intimate Partner Violence: Not on file   Family History  Problem Relation Age of Onset   Arthritis Mother    Hypertension Mother    COPD Mother    Kidney disease Sister    Cancer Brother     Current Outpatient Medications  Medication Sig Dispense Refill   lisinopril (PRINIVIL,ZESTRIL) 5 MG tablet Take 2 tablets (10 mg total) by mouth daily. 60 tablet 1   furosemide (LASIX) 20 MG tablet Take 1 tablet (20 mg total) by  mouth daily. 30 tablet 11   potassium chloride (K-DUR) 10 MEQ tablet Take 1 tablet (10 mEq total) by mouth daily. 30 tablet 0   Rivaroxaban 15 & 20 MG TBPK Take as directed on package: Start with one 15mg  tablet by mouth twice a day with food. On Day 22, switch to one 20mg  tablet once a day with food. 51 each 0   No current facility-administered medications for this visit.    No Known Allergies  REVIEW OF SYSTEMS (negative unless checked):   Cardiac:  []  Chest pain or chest pressure? []  Shortness of breath upon activity? []  Shortness of breath when lying flat? []  Irregular heart rhythm?  Vascular:  []  Pain in calf, thigh, or hip brought on by walking? []  Pain in feet at night that wakes you up from your sleep? []  Blood  clot in your veins? [x]  Leg swelling?  Pulmonary:  []  Oxygen at home? []  Productive cough? []  Wheezing?  Neurologic:  []  Sudden weakness in arms or legs? []  Sudden numbness in arms or legs? []  Sudden onset of difficult speaking or slurred speech? []  Temporary loss of vision in one eye? []  Problems with dizziness?  Gastrointestinal:  []  Blood in stool? []  Vomited blood?  Genitourinary:  []  Burning when urinating? []  Blood in urine?  Psychiatric:  []  Major depression  Hematologic:  []  Bleeding problems? []  Problems with blood clotting?  Dermatologic:  []  Rashes or ulcers?  Constitutional:  []  Fever or chills?  Ear/Nose/Throat:  []  Change in hearing? []  Nose bleeds? []  Sore throat?  Musculoskeletal:  []  Back pain? []  Joint pain? []  Muscle pain?   Physical Examination     Vitals:   03/26/21 1336  BP: 124/85  Pulse: 63  Resp: 18  Temp: 97.7 F (36.5 C)  TempSrc: Temporal  SpO2: 99%  Weight: 250 lb (113.4 kg)  Height: 5\' 7"  (1.702 m)   Body mass index is 39.16 kg/m.  General:  WDWN in NAD; vital signs documented above Gait: Normal HENT: WNL, normocephalic Pulmonary: normal non-labored breathing , without wheezing Cardiac: regular HR Abdomen: obese, soft Vascular Exam/Pulses:  Right Left  Radial 2+ (normal) 2+ (normal)  Femoral 2+ (normal) 2+ (normal)  Popliteal 2+ (normal) 2+ (normal)  DP 2+ (normal) 2+ (normal)  PT 2+ (normal) 2+ (normal)   Extremities: without varicose veins of right leg, with reticular veins of bilateral legs, without edema, without stasis pigmentation, without lipodermatosclerosis, without ulcers. Left > right leg swelling   Musculoskeletal: no muscle wasting or atrophy  Neurologic: A&O X 3;  No focal weakness or paresthesias are detected Psychiatric:  The pt has Normal affect.  Non-invasive Vascular Imaging   BLE Venous Insufficiency Duplex (03/26/21):  LLE: Chronic DVT in popliteal and gastroc vein, no SVT GSV  reflux at [facility] , proximal thigh, proximal calf GSV diameter 0.40-0.46 SSV reflux popliteal fossa to mid calf No deep venous reflux AASV with reflux Mid calf perforator noted with reflux   Medical Decision Making   Richard Leon is a 60 y.o. male who presents with: LLE chronic venous insufficiency with swelling. He has history of BLE DVT. His duplex today shows some chronic DVT in the popliteal and gastroc veins on the left leg, he also has GSV reflux, SSV reflux and AASV reflux. His swelling I think is a combination of venous insufficiency as well as secondary to his chronic DVT and damage to the veins from prior clot burden. Although he has a lot of reflux and  his superficial system is of adequate size to be considered for lazer ablation I discussed with him that due to his Chronic DVT I would not recommend this. With chronic obstruction of the deep veins if we close down the superficial system he will have worsening symptoms Based on the patient's history and examination, I recommend conservative therapy with exercise, weight reduction, elevation, compression and refraining from prolonged sitting or standing.  I have recommended knee high 15-20 mmHg compression for daily use I discussed also that he would benefit from thigh high. He would like to purchase one of each today He will follow up as needed if he has new or worsening symptoms    Graceann Congress, PA-C [facility] Office: [phone]  03/26/2021, 2:13 PM  On call MD: Lenell Antu

## 2021-04-02 DIAGNOSIS — M7989 Other specified soft tissue disorders: Secondary | ICD-10-CM

## 2022-05-07 ENCOUNTER — Emergency Department (HOSPITAL_BASED_OUTPATIENT_CLINIC_OR_DEPARTMENT_OTHER): Payer: Managed Care, Other (non HMO)

## 2022-05-07 ENCOUNTER — Other Ambulatory Visit: Payer: Self-pay

## 2022-05-07 ENCOUNTER — Emergency Department (HOSPITAL_BASED_OUTPATIENT_CLINIC_OR_DEPARTMENT_OTHER)
Admission: EM | Admit: 2022-05-07 | Discharge: 2022-05-07 | Disposition: A | Discharge status: Home / Self Care | Payer: Managed Care, Other (non HMO) | Attending: Emergency Medicine | Admitting: Emergency Medicine

## 2022-05-07 ENCOUNTER — Ambulatory Visit
Admission: EM | Admit: 2022-05-07 | Discharge: 2022-05-07 | Disposition: A | Discharge status: Home / Self Care | Payer: Managed Care, Other (non HMO)

## 2022-05-07 ENCOUNTER — Encounter: Payer: Self-pay | Admitting: Emergency Medicine

## 2022-05-07 DIAGNOSIS — Z79899 Other long term (current) drug therapy: Secondary | ICD-10-CM | POA: Diagnosis not present

## 2022-05-07 DIAGNOSIS — I82402 Acute embolism and thrombosis of unspecified deep veins of left lower extremity: Secondary | ICD-10-CM | POA: Diagnosis not present

## 2022-05-07 DIAGNOSIS — M7989 Other specified soft tissue disorders: Secondary | ICD-10-CM

## 2022-05-07 DIAGNOSIS — M79605 Pain in left leg: Secondary | ICD-10-CM | POA: Diagnosis present

## 2022-05-07 DIAGNOSIS — I824Y2 Acute embolism and thrombosis of unspecified deep veins of left proximal lower extremity: Secondary | ICD-10-CM

## 2022-05-07 LAB — BASIC METABOLIC PANEL
Anion gap: 4 — ABNORMAL LOW (ref 5–15)
BUN: 17 mg/dL (ref 8–23)
CO2: 31 mmol/L (ref 22–32)
Calcium: 9.7 mg/dL (ref 8.9–10.3)
Chloride: 101 mmol/L (ref 98–111)
Creatinine, Ser: 0.93 mg/dL (ref 0.61–1.24)
GFR, Estimated: >60 mL/min (ref >60)
Glucose, Bld: 82 mg/dL (ref 70–99)
Potassium: 4.2 mmol/L (ref 3.5–5.1)
Sodium: 136 mmol/L (ref 135–145)

## 2022-05-07 LAB — CBC WITH DIFFERENTIAL/PLATELET
Abs Immature Granulocytes: 0.01 10*3/uL (ref 0.00–0.07)
Basophils Absolute: 0 10*3/uL (ref 0.0–0.1)
Basophils Relative: 1 %
Eosinophils Absolute: 0.1 10*3/uL (ref 0.0–0.5)
Eosinophils Relative: 1 %
HCT: 41.9 % (ref 39.0–52.0)
Hemoglobin: 13.7 g/dL (ref 13.0–17.0)
Immature Granulocytes: 0 %
Lymphocytes Relative: 34 %
Lymphs Abs: 1.7 10*3/uL (ref 0.7–4.0)
MCH: 29.1 pg (ref 26.0–34.0)
MCHC: 32.7 g/dL (ref 30.0–36.0)
MCV: 89 fL (ref 80.0–100.0)
Monocytes Absolute: 0.4 10*3/uL (ref 0.1–1.0)
Monocytes Relative: 8 %
Neutro Abs: 2.7 10*3/uL (ref 1.7–7.7)
Neutrophils Relative %: 56 %
Platelets: 150 10*3/uL (ref 150–400)
RBC: 4.71 MIL/uL (ref 4.22–5.81)
RDW: 14.6 % (ref 11.5–15.5)
WBC: 4.9 10*3/uL (ref 4.0–10.5)
nRBC: 0 % (ref 0.0–0.2)

## 2022-05-07 MED ORDER — RIVAROXABAN 15 MG PO TABS
15.0000 mg | ORAL_TABLET | Freq: Once | ORAL | Status: AC
  Administered 2022-05-07: 15 mg via ORAL
  Filled 2022-05-07: qty 1

## 2022-05-07 MED ORDER — RIVAROXABAN (XARELTO) VTE STARTER PACK (15 & 20 MG)
ORAL_TABLET | ORAL | 0 refills | Status: DC
Start: 2022-05-07 — End: 2024-07-09

## 2022-05-07 NOTE — ED Triage Notes (Addendum)
Patient arrives with complaints of worsening left leg pain x3 days. Patient does have notable swelling/redness to left lower leg. Tender to touch.    Patient states that he went to Urgent Care this morning and he was sent here to get an ultrasound to rule out a DVT. Not currently taking blood thinners,  HX of DVT in 2019.

## 2022-05-07 NOTE — ED Notes (Signed)
Patient is being discharged from the Urgent Care and sent to the Emergency Department via Personal Vehicle . Per Christeen Douglas, patient is in need of higher level of care due to Possible DVT and needs [place]. Patient is aware and verbalizes understanding of plan of care.  Vitals:   05/07/22 0816  BP: 123/74  Pulse: 62  Resp: 14  Temp: 97.7 F (36.5 C)  SpO2: 97%

## 2022-05-07 NOTE — Discharge Instructions (Signed)
Recommend ultrasound of left lower extremity today to rule out DVT Go to the ED for further evaluation

## 2022-05-07 NOTE — ED Triage Notes (Signed)
Patient c/o LFT lower leg pain x 3 days.   Patient denies warmth to touch.   Patient endorses worsening swelling, redness, and pain.   Patient endorses increased pain when moving and sitting down.   History of DVT. History of Cellulitis.   Patient hasn't taken any medications for symptoms.

## 2022-05-07 NOTE — ED Provider Notes (Signed)
[facility] EMERGENCY DEPT Provider Note   CSN: [id number] Arrival date & time: 05/07/22  0856     History  Chief Complaint  Patient presents with   Leg Pain    Left, Possible DVT    Richard Leon is a 61 y.o. male.  HPI     61 year old male with previous history of DVT-PE comes in with chief complaint of left leg swelling.  Patient states that he started noticing swelling over his left leg 3 days ago.  He went to the urgent care, was sent to the emergency room for further evaluation.  He denies any chest pain, shortness of breath, palpitations.  Patient has a DVT in the past and also PE.  He is not on blood thinners at this point.  He indicates that there is family history of PE as well, however his blood work did not show any concerning findings.  Home Medications Prior to Admission medications   Medication Sig Start Date End Date Taking? Authorizing Provider  RIVAROXABAN Carlena Hurl) VTE STARTER PACK (15 & 20 MG) Follow package directions: Take one 15mg  tablet by mouth twice a day. On day 22, switch to one 20mg  tablet once a day. Take with food. 05/07/22  Yes , MD  furosemide (LASIX) 20 MG tablet Take 1 tablet (20 mg total) by mouth daily. 10/20/17 10/20/18  12/18/17, MD  lisinopril (PRINIVIL,ZESTRIL) 5 MG tablet Take 2 tablets (10 mg total) by mouth daily. 10/21/17   Alwyn Ren, MD  potassium chloride (K-DUR) 10 MEQ tablet Take 1 tablet (10 mEq total) by mouth daily. 10/20/17   Alwyn Ren, MD      Allergies    Patient has no known allergies.    Review of Systems   Review of Systems  All other systems reviewed and are negative.   Physical Exam Updated Vital Signs BP 123/86   Pulse (!) 51   Temp 97.9 F (36.6 C)   Resp 14   Ht 5\' 7"  (1.702 m)   Wt 113 kg   SpO2 100%   BMI 39.02 kg/m  Physical Exam Vitals and nursing note reviewed.  Constitutional:      Appearance: He is well-developed.  HENT:      Head: Atraumatic.  Cardiovascular:     Rate and Rhythm: Normal rate.  Pulmonary:     Effort: Pulmonary effort is normal.  Musculoskeletal:        General: Swelling present.     Cervical back: Neck supple.     Comments: Patient has unilateral leg swelling in the left lower extremity  Skin:    General: Skin is warm.  Neurological:     Mental Status: He is alert and oriented to person, place, and time.     ED Results / Procedures / Treatments   Labs (all labs ordered are listed, but only abnormal results are displayed) Labs Reviewed  CBC WITH DIFFERENTIAL/PLATELET  BASIC METABOLIC PANEL    EKG None  Radiology 12/18/17 Venous Img Lower Unilateral Left (DVT)  Result Date: 05/07/2022 CLINICAL DATA:  61 year old male with a history of left-sided pain EXAM: LEFT LOWER EXTREMITY VENOUS DOPPLER ULTRASOUND TECHNIQUE: Gray-scale sonography with graded compression, as well as color Doppler and duplex ultrasound were performed to evaluate the lower extremity deep venous systems from the level of the common femoral vein and including the common femoral, femoral, profunda femoral, popliteal and calf veins including the posterior tibial, peroneal and gastrocnemius veins when visible. The superficial  great saphenous vein was also interrogated. Spectral Doppler was utilized to evaluate flow at rest and with distal augmentation maneuvers in the common femoral, femoral and popliteal veins. COMPARISON:  None Available. FINDINGS: Contralateral Common Femoral Vein: Respiratory phasicity is normal and symmetric with the symptomatic side. No evidence of thrombus. Normal compressibility. Common Femoral Vein: No evidence of thrombus. Normal compressibility, respiratory phasicity and response to augmentation. Saphenofemoral Junction: No evidence of thrombus. Normal compressibility and flow on color Doppler imaging. Profunda Femoral Vein: No evidence of thrombus. Normal compressibility and flow on color Doppler imaging.  Femoral Vein: No evidence of thrombus. Normal compressibility, respiratory phasicity and response to augmentation. Popliteal Vein: Partially compressible with thrombus in the popliteal vein. Calf Veins: Limited visualization of the tibial veins. Superficial Great Saphenous Vein: Short segment superficial thrombophlebitis of the great saphenous vein within the superior calf. Additional superficial thrombophlebitis of calf varicosity/reticular vein. Other Findings:  None IMPRESSION: Directed duplex of left lower extremity positive for DVT, with nonocclusive DVT of the popliteal vein. Short segment great saphenous vein superficial thrombophlebitis of the calf with associated varicose vein superficial thrombophlebitis. Signed, Yvone Neu. Miachel Roux, RPVI [facility] Radiology Electronically Signed   By: Gilmer Mor D.O.   On: 05/07/2022 10:37    Procedures Procedures    Medications Ordered in ED Medications  Rivaroxaban (XARELTO) tablet 15 mg (15 mg Oral Given 05/07/22 1125)    ED Course/ Medical Decision Making/ A&P                           Medical Decision Making Amount and/or Complexity of Data Reviewed Labs: ordered.  Risk Prescription drug management.   This patient presents to the ED with chief complaint(s) of left leg swelling with pertinent past medical history of previous history of DVT, PE and family history of DVT.The complaint involves an extensive differential diagnosis and also carries with it a high risk of complications and morbidity.    The differential diagnosis includes acute DVT, chronic DVT, cellulitis, venous stasis  The initial plan is to get ultrasound DVT ordered.   Additional history obtained: Records reviewed previous admission documents.  It is clear per discharge summary in 2019, that patient was counseled that he will likely need to be on blood thinners long-term or rest of his life.  Subsequently, he had a  vascular surgery follow-up where there was conversation about vascular insufficiency because of chronic DVT.  Independent labs interpretation:  The following labs were independently interpreted: Normal CBC and metabolic profile  Independent visualization and interpretation of imaging: - I independently visualized the following imaging with scope of interpretation limited to determining acute life threatening conditions related to emergency care: DVT study, which revealed concerns for reduced compressibility in one of the veins.  DVT study is being read as positive for DVT in the popliteal vein. I have to assume its acute given the 3-day history of new leg swelling.  We will start him on Xarelto.  Treatment and Reassessment: Results of the ER work-up discussed with the patient.  He will be started on Xarelto.  Complexity of problems addressed:  4: acute, complicated injury (g)  Final Clinical Impression(s) / ED Diagnoses Final diagnoses:  Acute deep vein thrombosis (DVT) of proximal vein of left lower extremity (HCC)    Rx / DC Orders ED Discharge Orders          Ordered    RIVAROXABAN (XARELTO) VTE  STARTER PACK (15 & 20 MG)        05/07/22 1118              Derwood Kaplan, MD 05/07/22 1131

## 2022-05-07 NOTE — ED Provider Notes (Signed)
[facility]    CSN: [id number] Arrival date & time: 05/07/22  0801      History   Chief Complaint Chief Complaint  Patient presents with   Leg Pain    HPI Richard Leon is a 61 y.o. male.   Pt presents with left lower extremity swelling and pain that started about three days ago.  H/o DVT in the past.  He is not currently anticoagulated.  He reports pain is worse with moving and after sitting for long periods.  Denies shortness of breath, chest pain, palpitations.     Past Medical History:  Diagnosis Date   DVT (deep venous thrombosis) (HCC)    Hypertension     Patient Active Problem List   Diagnosis Date Noted   Combined arterial insufficiency and corporo-venous occlusive erectile dysfunction 01/19/2020   Pulmonary emboli (HCC) 10/18/2017   Pulmonary embolism (HCC) 10/18/2017   Chronic deep vein thrombosis (DVT) of popliteal vein of left lower extremity (HCC) 12/08/2016   Morbid obesity (HCC) 12/08/2016   Abnormal CXR 02/13/2014   HTN (hypertension) 10/04/2013    Past Surgical History:  Procedure Laterality Date   None         Home Medications    Prior to Admission medications   Medication Sig Start Date End Date Taking? Authorizing Provider  lisinopril (PRINIVIL,ZESTRIL) 5 MG tablet Take 2 tablets (10 mg total) by mouth daily. 10/21/17  Yes Alwyn Ren, MD  furosemide (LASIX) 20 MG tablet Take 1 tablet (20 mg total) by mouth daily. 10/20/17 10/20/18  Alwyn Ren, MD  potassium chloride (K-DUR) 10 MEQ tablet Take 1 tablet (10 mEq total) by mouth daily. 10/20/17   Alwyn Ren, MD  RIVAROXABAN Carlena Hurl) VTE STARTER PACK (15 & 20 MG) Follow package directions: Take one 15mg  tablet by mouth twice a day. On day 22, switch to one 20mg  tablet once a day. Take with food. 05/07/22   , MD    Family History Family History  Problem Relation Age of Onset   Arthritis Mother    Hypertension Mother    COPD Mother     Kidney disease Sister    Cancer Brother     Social History Social History   Tobacco Use   Smoking status: Never   Smokeless tobacco: Never  Substance Use Topics   Alcohol use: No    Alcohol/week: 0.0 standard drinks of alcohol   Drug use: No     Allergies   Patient has no known allergies.   Review of Systems Review of Systems  Constitutional:  Negative for chills and fever.  HENT:  Negative for ear pain and sore throat.   Eyes:  Negative for pain and visual disturbance.  Respiratory:  Negative for cough and shortness of breath.   Cardiovascular:  Positive for leg swelling. Negative for chest pain and palpitations.  Gastrointestinal:  Negative for abdominal pain and vomiting.  Genitourinary:  Negative for dysuria and hematuria.  Musculoskeletal:  Negative for arthralgias and back pain.  Skin:  Negative for color change and rash.  Neurological:  Negative for seizures and syncope.  All other systems reviewed and are negative.    Physical Exam Triage Vital Signs ED Triage Vitals  Enc Vitals Group     BP 05/07/22 0816 123/74     Pulse Rate 05/07/22 0816 62     Resp 05/07/22 0816 14     Temp 05/07/22 0816 97.7 F (36.5 C)     Temp  Source 05/07/22 0816 Oral     SpO2 05/07/22 0816 97 %     Weight --      Height --      Head Circumference --      Peak Flow --      Pain Score 05/07/22 0821 7     Pain Loc --      Pain Edu? --      Excl. in GC? --    No data found.  Updated Vital Signs BP 123/74 (BP Location: Left Arm)   Pulse 62   Temp 97.7 F (36.5 C) (Oral)   Resp 14   SpO2 97%   Visual Acuity Right Eye Distance:   Left Eye Distance:   Bilateral Distance:    Right Eye Near:   Left Eye Near:    Bilateral Near:     Physical Exam Vitals and nursing note reviewed.  Constitutional:      General: He is not in acute distress.    Appearance: He is well-developed.  HENT:     Head: Normocephalic and atraumatic.  Eyes:     Conjunctiva/sclera:  Conjunctivae normal.  Cardiovascular:     Rate and Rhythm: Normal rate and regular rhythm.     Heart sounds: No murmur heard. Pulmonary:     Effort: Pulmonary effort is normal. No respiratory distress.     Breath sounds: Normal breath sounds.  Abdominal:     Palpations: Abdomen is soft.     Tenderness: There is no abdominal tenderness.  Musculoskeletal:        General: No swelling.     Cervical back: Neck supple.     Comments: Left lower extremity swelling with TTP, some mild warmth and redness noted.   Skin:    General: Skin is warm and dry.     Capillary Refill: Capillary refill takes less than 2 seconds.  Neurological:     Mental Status: He is alert.  Psychiatric:        Mood and Affect: Mood normal.      UC Treatments / Results  Labs (all labs ordered are listed, but only abnormal results are displayed) Labs Reviewed - No data to display  EKG   Radiology  Procedures Procedures (including critical care time)  Medications Ordered in UC Medications - No data to display  Initial Impression / Assessment and Plan / UC Course  I have reviewed the triage vital signs and the nursing notes.  Pertinent labs & imaging results that were available during my care of the patient were reviewed by me and considered in my medical decision making (see chart for details).     Left lower extremity pain and swelling. Concerned for DVT given sx and pt h/o DVT.  Unable to order outpatient [place] on the weekends.  Advised pt to go to the ED for evaluation and [place].   Final Clinical Impressions(s) / UC Diagnoses   Final diagnoses:  Pain and swelling of lower extremity, left     Discharge Instructions      Recommend ultrasound of left lower extremity today to rule out DVT Go to the ED for further evaluation    ED Prescriptions   None    PDMP not reviewed this encounter.   Ward, Tylene Fantasia, PA-C 05/07/22 1209

## 2022-05-07 NOTE — Discharge Instructions (Signed)
You are seen in the ER for leg swelling.  DVT study is positive for blood clot in your left leg.  Please start taking the blood thinners that is prescribed.  Follow-up with your primary care doctor in 2 weeks.  Return to the emergency room if you start having heavy bleeding or there is trauma to your head.

## 2022-05-07 NOTE — ED Notes (Signed)
Dc instructions reviewed with patient. Patient voiced understanding. Dc with belongings.  °

## 2024-07-07 ENCOUNTER — Emergency Department (HOSPITAL_BASED_OUTPATIENT_CLINIC_OR_DEPARTMENT_OTHER)

## 2024-07-07 ENCOUNTER — Ambulatory Visit
Admission: EM | Admit: 2024-07-07 | Discharge: 2024-07-07 | Disposition: A | Discharge status: Home / Self Care | Attending: Family Medicine | Admitting: Family Medicine

## 2024-07-07 ENCOUNTER — Observation Stay (HOSPITAL_BASED_OUTPATIENT_CLINIC_OR_DEPARTMENT_OTHER)
Admission: EM | Admit: 2024-07-07 | Discharge: 2024-07-09 | Disposition: A | Discharge status: Home / Self Care | Attending: Student | Admitting: Student

## 2024-07-07 ENCOUNTER — Other Ambulatory Visit: Payer: Self-pay

## 2024-07-07 ENCOUNTER — Encounter (HOSPITAL_BASED_OUTPATIENT_CLINIC_OR_DEPARTMENT_OTHER): Payer: Self-pay

## 2024-07-07 DIAGNOSIS — Z79899 Other long term (current) drug therapy: Secondary | ICD-10-CM | POA: Diagnosis not present

## 2024-07-07 DIAGNOSIS — J449 Chronic obstructive pulmonary disease, unspecified: Secondary | ICD-10-CM | POA: Diagnosis not present

## 2024-07-07 DIAGNOSIS — D696 Thrombocytopenia, unspecified: Secondary | ICD-10-CM | POA: Diagnosis not present

## 2024-07-07 DIAGNOSIS — I10 Essential (primary) hypertension: Secondary | ICD-10-CM | POA: Insufficient documentation

## 2024-07-07 DIAGNOSIS — I2699 Other pulmonary embolism without acute cor pulmonale: Secondary | ICD-10-CM | POA: Diagnosis present

## 2024-07-07 DIAGNOSIS — K8020 Calculus of gallbladder without cholecystitis without obstruction: Secondary | ICD-10-CM | POA: Insufficient documentation

## 2024-07-07 DIAGNOSIS — I82409 Acute embolism and thrombosis of unspecified deep veins of unspecified lower extremity: Secondary | ICD-10-CM | POA: Diagnosis present

## 2024-07-07 DIAGNOSIS — I82413 Acute embolism and thrombosis of femoral vein, bilateral: Secondary | ICD-10-CM | POA: Diagnosis present

## 2024-07-07 DIAGNOSIS — R531 Weakness: Secondary | ICD-10-CM | POA: Diagnosis not present

## 2024-07-07 DIAGNOSIS — Z7982 Long term (current) use of aspirin: Secondary | ICD-10-CM | POA: Diagnosis not present

## 2024-07-07 DIAGNOSIS — E538 Deficiency of other specified B group vitamins: Secondary | ICD-10-CM | POA: Diagnosis not present

## 2024-07-07 DIAGNOSIS — M7989 Other specified soft tissue disorders: Secondary | ICD-10-CM | POA: Diagnosis not present

## 2024-07-07 DIAGNOSIS — R001 Bradycardia, unspecified: Secondary | ICD-10-CM | POA: Diagnosis not present

## 2024-07-07 DIAGNOSIS — D649 Anemia, unspecified: Secondary | ICD-10-CM | POA: Diagnosis not present

## 2024-07-07 DIAGNOSIS — R600 Localized edema: Secondary | ICD-10-CM | POA: Diagnosis present

## 2024-07-07 DIAGNOSIS — Z23 Encounter for immunization: Secondary | ICD-10-CM | POA: Insufficient documentation

## 2024-07-07 DIAGNOSIS — Z86718 Personal history of other venous thrombosis and embolism: Secondary | ICD-10-CM | POA: Diagnosis not present

## 2024-07-07 DIAGNOSIS — I824Y3 Acute embolism and thrombosis of unspecified deep veins of proximal lower extremity, bilateral: Secondary | ICD-10-CM

## 2024-07-07 DIAGNOSIS — I82403 Acute embolism and thrombosis of unspecified deep veins of lower extremity, bilateral: Secondary | ICD-10-CM | POA: Diagnosis not present

## 2024-07-07 DIAGNOSIS — Z743 Need for continuous supervision: Secondary | ICD-10-CM | POA: Diagnosis not present

## 2024-07-07 DIAGNOSIS — M79605 Pain in left leg: Secondary | ICD-10-CM | POA: Diagnosis not present

## 2024-07-07 DIAGNOSIS — M79604 Pain in right leg: Secondary | ICD-10-CM | POA: Diagnosis not present

## 2024-07-07 LAB — CBC WITH DIFFERENTIAL/PLATELET
Abs Immature Granulocytes: 0.02 K/uL (ref 0.00–0.07)
Basophils Absolute: 0 K/uL (ref 0.0–0.1)
Basophils Relative: 1 %
Eosinophils Absolute: 0.1 K/uL (ref 0.0–0.5)
Eosinophils Relative: 3 %
HCT: 36.2 % — ABNORMAL LOW (ref 39.0–52.0)
Hemoglobin: 12.2 g/dL — ABNORMAL LOW (ref 13.0–17.0)
Immature Granulocytes: 1 %
Lymphocytes Relative: 33 %
Lymphs Abs: 1.4 K/uL (ref 0.7–4.0)
MCH: 30.6 pg (ref 26.0–34.0)
MCHC: 33.7 g/dL (ref 30.0–36.0)
MCV: 90.7 fL (ref 80.0–100.0)
Monocytes Absolute: 0.4 K/uL (ref 0.1–1.0)
Monocytes Relative: 10 %
Neutro Abs: 2.2 K/uL (ref 1.7–7.7)
Neutrophils Relative %: 52 %
Platelets: 122 K/uL — ABNORMAL LOW (ref 150–400)
RBC: 3.99 MIL/uL — ABNORMAL LOW (ref 4.22–5.81)
RDW: 14.8 % (ref 11.5–15.5)
WBC: 4.2 K/uL (ref 4.0–10.5)
nRBC: 0 % (ref 0.0–0.2)

## 2024-07-07 LAB — TROPONIN I (HIGH SENSITIVITY): Troponin I (High Sensitivity): 6 ng/L (ref <18)

## 2024-07-07 LAB — COMPREHENSIVE METABOLIC PANEL WITH GFR
ALT: 13 U/L (ref 0–44)
AST: 22 U/L (ref 15–41)
Albumin: 3.7 g/dL (ref 3.5–5.0)
Alkaline Phosphatase: 57 U/L (ref 38–126)
Anion gap: 9 (ref 5–15)
BUN: 14 mg/dL (ref 8–23)
CO2: 28 mmol/L (ref 22–32)
Calcium: 10 mg/dL (ref 8.9–10.3)
Chloride: 105 mmol/L (ref 98–111)
Creatinine, Ser: 0.96 mg/dL (ref 0.61–1.24)
GFR, Estimated: >60 mL/min (ref >60)
Glucose, Bld: 86 mg/dL (ref 70–99)
Potassium: 4.6 mmol/L (ref 3.5–5.1)
Sodium: 142 mmol/L (ref 135–145)
Total Bilirubin: 1 mg/dL (ref 0.0–1.2)
Total Protein: 6.5 g/dL (ref 6.5–8.1)

## 2024-07-07 LAB — TSH: TSH: 1.117 u[IU]/mL (ref 0.350–4.500)

## 2024-07-07 LAB — TROPONIN T, HIGH SENSITIVITY: Troponin T High Sensitivity: <15 ng/L (ref 0–19)

## 2024-07-07 LAB — PRO BRAIN NATRIURETIC PEPTIDE: Pro Brain Natriuretic Peptide: 169 pg/mL (ref <300.0)

## 2024-07-07 MED ORDER — ACETAMINOPHEN 325 MG PO TABS: 650.0000 mg | ORAL_TABLET | Freq: Four times a day (QID) | ORAL | Status: DC | PRN

## 2024-07-07 MED ORDER — HEPARIN BOLUS VIA INFUSION
6000.0000 [IU] | Freq: Once | INTRAVENOUS | Status: AC
  Administered 2024-07-07: 6000 [IU] via INTRAVENOUS

## 2024-07-07 MED ORDER — IOHEXOL 350 MG/ML SOLN
75.0000 mL | Freq: Once | INTRAVENOUS | Status: AC | PRN
  Administered 2024-07-07: 75 mL via INTRAVENOUS

## 2024-07-07 MED ORDER — ACETAMINOPHEN 650 MG RE SUPP: 650.0000 mg | Freq: Four times a day (QID) | RECTAL | Status: DC | PRN

## 2024-07-07 MED ORDER — HEPARIN (PORCINE) 25000 UT/250ML-% IV SOLN
1500.0000 [IU]/h | INTRAVENOUS | Status: DC
  Administered 2024-07-07 – 2024-07-09 (×3): 1500 [IU]/h via INTRAVENOUS
  Filled 2024-07-07 (×3): qty 250

## 2024-07-07 NOTE — ED Provider Notes (Signed)
 [facility]    CSN: [id number] Arrival date & time: 07/07/24  0902      History   Chief Complaint No chief complaint on file.   HPI Richard Leon is a 63 y.o. male with a past medical history of hypertension and DVT presents for lower extremity swelling.  Patient reports 4 days of bilateral lower extremity swelling with redness and warmth.  States has pain primarily with ambulation.  Denies any chest pain, shortness of breath, orthopnea.  Denies history of CHF or chronic kidney disease.  Does state he takes lisinopril /HCTZ and has been out for 2 weeks as he cannot get his PCP to refill.  He states this is contributing to his swelling.  He does have a history of DVT in the left leg in the past as well as pulmonary embolus.  He was previously on blood thinning medications but is not currently on that.  Does report that swelling does somewhat improve after elevation.  States he is also at cellulitis before and this feels somewhat similar but he cannot completely exclude DVT.  No other concerns at this time  HPI  Past Medical History:  Diagnosis Date   DVT (deep venous thrombosis) (HCC)    Hypertension     Patient Active Problem List   Diagnosis Date Noted   Combined arterial insufficiency and corporo-venous occlusive erectile dysfunction 01/19/2020   Pulmonary emboli (HCC) 10/18/2017   Pulmonary embolism (HCC) 10/18/2017   Chronic deep vein thrombosis (DVT) of popliteal vein of left lower extremity (HCC) 12/08/2016   Morbid obesity (HCC) 12/08/2016   Abnormal CXR 02/13/2014   HTN (hypertension) 10/04/2013    Past Surgical History:  Procedure Laterality Date   None         Home Medications    Prior to Admission medications   Medication Sig Start Date End Date Taking? Authorizing Provider  furosemide  (LASIX ) 20 MG tablet Take 1 tablet (20 mg total) by mouth daily. 10/20/17 10/20/18  Will Almarie MATSU, MD  lisinopril  (PRINIVIL ,ZESTRIL ) 5 MG tablet Take 2  tablets (10 mg total) by mouth daily. 10/21/17   Will Almarie MATSU, MD  potassium chloride  (K-DUR) 10 MEQ tablet Take 1 tablet (10 mEq total) by mouth daily. 10/20/17   Will Almarie MATSU, MD  RIVAROXABAN  (XARELTO ) VTE STARTER PACK (15 & 20 MG) Follow package directions: Take one 15mg  tablet by mouth twice a day. On day 22, switch to one 20mg  tablet once a day. Take with food. 05/07/22   Charlyn Sora, MD    Family History Family History  Problem Relation Age of Onset   Arthritis Mother    Hypertension Mother    COPD Mother    Kidney disease Sister    Cancer Brother     Social History Social History   Tobacco Use   Smoking status: Never   Smokeless tobacco: Never  Substance Use Topics   Alcohol use: No    Alcohol/week: 0.0 standard drinks of alcohol   Drug use: No     Allergies   Patient has no known allergies.   Review of Systems Review of Systems  Skin:        Bilateral lower extremity swelling and redness     Physical Exam Triage Vital Signs ED Triage Vitals  Encounter Vitals Group     BP 07/07/24 0914 (!) 155/91     Girls Systolic BP Percentile --      Girls Diastolic BP Percentile --  Boys Systolic BP Percentile --      Boys Diastolic BP Percentile --      Pulse Rate 07/07/24 0914 (!) 47     Resp 07/07/24 0914 16     Temp 07/07/24 0914 97.7 F (36.5 C)     Temp Source 07/07/24 0914 Oral     SpO2 07/07/24 0914 97 %     Weight --      Height --      Head Circumference --      Peak Flow --      Pain Score 07/07/24 0913 3     Pain Loc --      Pain Education --      Exclude from Growth Chart --    No data found.  Updated Vital Signs BP (!) 155/91   Pulse (!) 47   Temp 97.7 F (36.5 C) (Oral)   Resp 16   SpO2 97%   Visual Acuity Right Eye Distance:   Left Eye Distance:   Bilateral Distance:    Right Eye Near:   Left Eye Near:    Bilateral Near:     Physical Exam Vitals and nursing note reviewed.  Constitutional:      General:  He is not in acute distress.    Appearance: Normal appearance. He is not ill-appearing.  HENT:     Head: Normocephalic and atraumatic.  Eyes:     Pupils: Pupils are equal, round, and reactive to light.  Cardiovascular:     Rate and Rhythm: Bradycardia present.     Comments: Appears to have a history of bradycardia Pulmonary:     Effort: Pulmonary effort is normal.  Musculoskeletal:     Right lower leg: Tenderness present. 2+ Pitting Edema present.     Left lower leg: Tenderness present. 2+ Pitting Edema present.     Comments: There is +2 pitting edema on bilateral lower legs from ankles to mid to proximal shin.  There is erythema and warmth to the anterior lower right leg as well as to the lateral mid to left lower leg.  No tenderness along calf pain with dorsi flexion or extension of feet.  DP +2 bilaterally.  Skin appears intact without flaking skin or abrasions.  Right calf measures 44 cm and left measures 46 cm  Skin:    General: Skin is warm and dry.  Neurological:     General: No focal deficit present.     Mental Status: He is alert and oriented to person, place, and time.  Psychiatric:        Mood and Affect: Mood normal.        Behavior: Behavior normal.      UC Treatments / Results  Labs (all labs ordered are listed, but only abnormal results are displayed) Labs Reviewed - No data to display  EKG   Radiology No results found.  Procedures Procedures (including critical care time)  Medications Ordered in UC Medications - No data to display  Initial Impression / Assessment and Plan / UC Course  I have reviewed the triage vital signs and the nursing notes.  Pertinent labs & imaging results that were available during my care of the patient were reviewed by me and considered in my medical decision making (see chart for details).      I reviewed exam and symptoms with patient.  Discussed limitations and abilities of urgent care.  Given his history of DVT with  bilateral lower extremity swelling with redness and  warmth advise he needs to go to the emergency room to rule out DVT.  He is in agreement with plan will go POV to the emergency room with his wife. Final Clinical Impressions(s) / UC Diagnoses   Final diagnoses:  History of DVT (deep vein thrombosis)  Pain and swelling of left lower extremity  Pain and swelling of right lower extremity     Discharge Instructions      Please go to the emergency room for further workup of your swelling and pain of your lower extremities/rule out DVT.    ED Prescriptions   None    PDMP not reviewed this encounter.   Loreda Myla SAUNDERS, NP 07/07/24 [phone]

## 2024-07-07 NOTE — ED Notes (Signed)
 His vascular study was just completed.

## 2024-07-07 NOTE — ED Notes (Signed)
 Patient is being discharged from the Urgent Care and sent to the Emergency Department via POV (family) . Per Myla, NP, patient is in need of higher level of care due to leg swelling. Patient is aware and verbalizes understanding of plan of care.  Vitals:   07/07/24 0914  BP: (!) 155/91  Pulse: (!) 47  Resp: 16  Temp: 97.7 F (36.5 C)  SpO2: 97%

## 2024-07-07 NOTE — ED Notes (Signed)
 Called [facility] to transport the patient to [facility] rm#[id number]

## 2024-07-07 NOTE — Discharge Instructions (Signed)
 Please go to the emergency room for further workup of your swelling and pain of your lower extremities/rule out DVT.

## 2024-07-07 NOTE — H&P (Incomplete)
 History and Physical    Richard Leon FMW:[id number] DOB: 1961/06/21 DOA: 07/07/2024  Patient coming from: Home.  Chief Complaint: Lower extremity swelling.  HPI: Richard Leon is a 63 y.o. male with history of recurrent PE and DVT used to be on Xarelto  until 2022 with history of hypertension presents to the ER with complaints of lower extremity swelling.  Patient states he takes lisinopril /hydrochlorothiazide  for which she did not have refills and has not taken the medicine for last 2 weeks.  Over the last 5 days she noticed increasing swelling of both lower extremity with some erythema.  Denies fever or chills.  Patient did not have any chest pain or shortness of breath.  Due to worsening swelling patient presented to the ER.  ED Course: In the ER Dopplers of the lower extremity show nonocclusive thrombus within the bilateral femoral veins popliteal veins and right peroneal vein.  An occlusive thrombus within the left gastrocnemius vein.  CT angiogram of the chest was showing limited evaluation of contrast bolus.  Heterogeneous opacification within the left upper and left segmental pulmonary artery branches equivocal for pulmonary embolus given the limitation due to the timing of the contrast bolus.  No central or lobar pulmonary emboli are identified.  Patient was started on heparin  infusion and admitted for further observation.  EKG shows sinus bradycardia with heart rate around the 40s.  Patient states he has chronic bradycardia from being athletic.  Labs show hemoglobin of 12.2 platelets 122 troponin less than 15.  Pro BNP 169.   Review of Systems: As per HPI, rest all negative.   Past Medical History:  Diagnosis Date   DVT (deep venous thrombosis) (HCC)    Hypertension     Past Surgical History:  Procedure Laterality Date   None       reports that he has never smoked. He has never used smokeless tobacco. He reports that he does not drink alcohol and does not use  drugs.  No Known Allergies  Family History  Problem Relation Age of Onset   Arthritis Mother    Hypertension Mother    COPD Mother    Kidney disease Sister    Cancer Brother     Prior to Admission medications   Medication Sig Start Date End Date Taking? Authorizing Provider  furosemide  (LASIX ) 20 MG tablet Take 1 tablet (20 mg total) by mouth daily. 10/20/17 10/20/18  Will Almarie MATSU, MD  lisinopril  (PRINIVIL ,ZESTRIL ) 5 MG tablet Take 2 tablets (10 mg total) by mouth daily. 10/21/17   Will Almarie MATSU, MD  potassium chloride  (K-DUR) 10 MEQ tablet Take 1 tablet (10 mEq total) by mouth daily. 10/20/17   Will Almarie MATSU, MD  RIVAROXABAN  (XARELTO ) VTE STARTER PACK (15 & 20 MG) Follow package directions: Take one 15mg  tablet by mouth twice a day. On day 22, switch to one 20mg  tablet once a day. Take with food. 05/07/22   Charlyn Sora, MD    Physical Exam: Constitutional: Moderately built and nourished.   Vitals:   07/07/24 1540 07/07/24 1545 07/07/24 1629 07/07/24 2015  BP: (!) 171/83 (!) 160/84  (!) 154/87  Pulse: (!) 43 (!) 45  (!) 42  Resp: (!) 24 13  15   Temp: 98.1 F (36.7 C)     TempSrc: Oral     SpO2: 100% 100%  100%  Weight:   85.3 kg   Height:   5' 7.01 (1.702 m)    Eyes: Anicteric no pallor. ENMT: No discharge from  the ears eyes nose or mouth. Neck: No mass felt.  No neck rigidity.  No JVD appreciated. Respiratory: No rhonchi or crepitations. Cardiovascular: S1-S2 heard. Abdomen: Soft nontender bowel sound present. Musculoskeletal: Bilateral lower extremity edema up to the knee. Skin: Mild erythema of the lower extremities.  No signs of cellulitis. Neurologic: Alert awake oriented time place and person.  Moves all extremities. Psychiatric: Appears normal.  Normal affect.   Labs on Admission: I have personally reviewed following labs and imaging studies  CBC: Recent Labs  Lab 07/07/24 1144  WBC 4.2  NEUTROABS 2.2  HGB 12.2*  HCT 36.2*  MCV 90.7   PLT 122*   Basic Metabolic Panel: Recent Labs  Lab 07/07/24 1144  NA 142  K 4.6  CL 105  CO2 28  GLUCOSE 86  BUN 14  CREATININE 0.96  CALCIUM 10.0   GFR: Estimated Creatinine Clearance: 81.1 mL/min (by C-G formula based on SCr of 0.96 mg/dL). Liver Function Tests: Recent Labs  Lab 07/07/24 1144  AST 22  ALT 13  ALKPHOS 57  BILITOT 1.0  PROT 6.5  ALBUMIN 3.7   No results for input(s): LIPASE, AMYLASE in the last 168 hours. No results for input(s): AMMONIA in the last 168 hours. Coagulation Profile: No results for input(s): INR, PROTIME in the last 168 hours. Cardiac Enzymes: No results for input(s): CKTOTAL, CKMB, CKMBINDEX, TROPONINI in the last 168 hours. BNP (last 3 results) Recent Labs    07/07/24 1144  PROBNP 169.0   HbA1C: No results for input(s): HGBA1C in the last 72 hours. CBG: No results for input(s): GLUCAP in the last 168 hours. Lipid Profile: No results for input(s): CHOL, HDL, LDLCALC, TRIG, CHOLHDL, LDLDIRECT in the last 72 hours. Thyroid  Function Tests: No results for input(s): TSH, T4TOTAL, FREET4, T3FREE, THYROIDAB in the last 72 hours. Anemia Panel: No results for input(s): VITAMINB12, FOLATE, FERRITIN, TIBC, IRON, RETICCTPCT in the last 72 hours. Urine analysis:    Component Value Date/Time   COLORURINE YELLOW 01/23/2014 1345   APPEARANCEUR CLEAR 01/23/2014 1345   LABSPEC 1.015 01/23/2014 1345   PHURINE 5.5 01/23/2014 1345   GLUCOSEU NEGATIVE 01/23/2014 1345   HGBUR NEGATIVE 01/23/2014 1345   BILIRUBINUR NEGATIVE 01/23/2014 1345   KETONESUR NEGATIVE 01/23/2014 1345   PROTEINUR NEGATIVE 01/23/2014 1345   UROBILINOGEN 1.0 01/23/2014 1345   NITRITE NEGATIVE 01/23/2014 1345   LEUKOCYTESUR NEGATIVE 01/23/2014 1345   Sepsis Labs: @LABRCNTIP (procalcitonin:4,lacticidven:4) )No results found for this or any previous visit (from the past 240 hours).   Radiological Exams on  Admission: CT Angio Chest PE W and/or Wo Contrast Result Date: 07/07/2024 CLINICAL DATA:  Pulmonary embolism (PE) suspected, high prob, lower extremity edema, DVT EXAM: CT ANGIOGRAPHY CHEST WITH CONTRAST TECHNIQUE: Multidetector CT imaging of the chest was performed using the standard protocol during bolus administration of intravenous contrast. Multiplanar CT image reconstructions and MIPs were obtained to evaluate the vascular anatomy. RADIATION DOSE REDUCTION: This exam was performed according to the departmental dose-optimization program which includes automated exposure control, adjustment of the mA and/or kV according to patient size and/or use of iterative reconstruction technique. CONTRAST:  75mL OMNIPAQUE IOHEXOL 350 MG/ML SOLN COMPARISON:  10/18/2017, 07/07/2024 FINDINGS: Cardiovascular: There is suboptimal opacification of the segmental and subsegmental branches of the pulmonary artery due to timing of contrast bolus. There is somewhat heterogeneous opacification within the left upper and left lower lobe segmental vessels, equivocal for pulmonary embolus. No central or lobar emboli are identified. The heart is unremarkable without pericardial effusion.  Normal caliber of the thoracic aorta. Mediastinum/Nodes: No enlarged mediastinal, hilar, or axillary lymph nodes. Thyroid  gland, trachea, and esophagus demonstrate no significant findings. Lungs/Pleura: No acute airspace disease, effusion, or pneumothorax. Central airways are patent. Upper Abdomen: Calcified gallstones without cholecystitis. Musculoskeletal: No acute or destructive bony abnormalities. Reconstructed images demonstrate no additional findings. Review of the MIP images confirms the above findings. IMPRESSION: 1. Limited evaluation of the segmental and subsegmental pulmonary arterial branches due to timing of contrast bolus. 2. Somewhat heterogeneous opacification within the left upper and left lower lobe segmental pulmonary arterial  branches, equivocal for pulmonary embolus given limitations due to timing of contrast bolus described above. No central or lobar pulmonary emboli are identified. 3. No acute airspace disease. 4. Incidental cholelithiasis without cholecystitis. Electronically Signed   By: Ozell Daring M.D.   On: 07/07/2024 14:59   DG Chest Port 1 View Result Date: 07/07/2024 CLINICAL DATA:  Leg swelling. EXAM: PORTABLE CHEST 1 VIEW COMPARISON:  October 18, 2017 FINDINGS: The heart size and mediastinal contours are within normal limits. Mild atelectatic changes are suspected within the right lung base. No acute infiltrate, pleural effusion or pneumothorax is identified. Multilevel degenerative changes are present throughout the thoracic spine. IMPRESSION: No active disease. Electronically Signed   By: Suzen Dials M.D.   On: 07/07/2024 12:24   US  Venous Img Lower Bilateral (DVT) Result Date: 07/07/2024 CLINICAL DATA:  Bilateral lower extremity pain, redness and swelling. EXAM: BILATERAL LOWER EXTREMITY VENOUS DOPPLER ULTRASOUND TECHNIQUE: Gray-scale sonography with graded compression, as well as color Doppler and duplex ultrasound were performed to evaluate the lower extremity deep venous systems from the level of the common femoral vein and including the common femoral, femoral, profunda femoral, popliteal and calf veins including the posterior tibial, peroneal and gastrocnemius veins when visible. The superficial great saphenous vein was also interrogated. Spectral Doppler was utilized to evaluate flow at rest and with distal augmentation maneuvers in the common femoral, femoral and popliteal veins. COMPARISON:  None Available. FINDINGS: RIGHT LOWER EXTREMITY Common Femoral Vein: No evidence of thrombus. Normal compressibility, respiratory phasicity and response to augmentation. Saphenofemoral Junction: No evidence of thrombus. Normal compressibility and flow on color Doppler imaging. Profunda Femoral Vein: No  evidence of thrombus. Normal compressibility and flow on color Doppler imaging. Femoral Vein: There is evidence of nonocclusive thrombus within the mid to distal portion of the RIGHT femoral vein, with abnormal compressibility, respiratory phasicity and response to augmentation. Popliteal Vein: There is evidence of nonocclusive thrombus within the RIGHT popliteal vein, with abnormal compressibility, respiratory phasicity and response to augmentation. Calf Veins: The RIGHT posterior tibial vein is limited in visualization. There is evidence of nonocclusive thrombus within the RIGHT peroneal vein, with abnormal compressibility and flow on color Doppler imaging. Superficial Great Saphenous Vein: No evidence of thrombus. Normal compressibility. Venous Reflux:  None. Other Findings:  None. LEFT LOWER EXTREMITY Common Femoral Vein: No evidence of thrombus. Normal compressibility, respiratory phasicity and response to augmentation. Saphenofemoral Junction: No evidence of thrombus. Normal compressibility and flow on color Doppler imaging. Profunda Femoral Vein: No evidence of thrombus. Normal compressibility and flow on color Doppler imaging. Femoral Vein: There is evidence of nonocclusive thrombus within the distal aspect of the LEFT femoral vein, with abnormal compressibility, respiratory phasicity and response to augmentation. Popliteal Vein: There is evidence of nonocclusive thrombus within the LEFT popliteal vein, with abnormal compressibility, respiratory phasicity and response to augmentation. Calf Veins: The LEFT posterior tibial vein and LEFT peroneal vein are limited in  visualization. Superficial Great Saphenous Vein: No evidence of thrombus. Normal compressibility. Venous Reflux:  None. Other Findings: There is evidence of occlusive thrombus within the LEFT gastrocnemius vein. IMPRESSION: 1. Limited evaluation of the BILATERAL posterior tibial veins and LEFT peroneal vein. 2. Nonocclusive thrombus within the  BILATERAL femoral veins, BILATERAL popliteal veins and RIGHT peroneal vein. 3. Occlusive thrombus within the LEFT gastrocnemius vein. Electronically Signed   By: Suzen Dials M.D.   On: 07/07/2024 12:23    EKG: Independently reviewed.  Sinus bradycardia.  Assessment/Plan Principal Problem:   Acute bilateral deep vein thrombosis (DVT) of femoral veins (HCC) Active Problems:   HTN (hypertension)   Sinus bradycardia   DVT (deep venous thrombosis) (HCC)    Bilateral lower extremity DVT with possible PE with history of recurrent PE and DVT previously and also family history of patient's mother having DVT.  Patient started on a heparin  infusion likely will need to be on chronic anticoagulation given the recurrent episodes.  Patient is presently hemodynamically stable.  Will check 2D echo.  Troponins and BNP negative. Sinus bradycardia patient states he has chronic sinus bradycardia being athletic.  Will check TSH.  Continue to monitor in telemetry.  Discussed with cardiology.  Addendum - discussed with cardiologist Dr. Otelia.  Since patient is asymptomatic and sinus bradycardia no further workup. Lower extremity edema likely from DVT.  Could also have some venous insufficiency.  2D echo done in 2015 showed grade 1 diastolic dysfunction.  Check 2D echo.  Continue therapy for DVT. Hypertension patient states until 2 weeks ago was on lisinopril  hydrochlorothiazide  20/25 mg.  He ran out of refills and has not taken the medicines.  Will restart antihypertensive continues to remain hemodynamically stable. Anemia appears to be new.  Check anemia panel follow CBC.  Will need colonoscopy updated given the DVT. Thrombocytopenia appears to be chronic follow CBC. Gallstones seen on CAT scan with no signs of cholecystitis.  Abdomen appears benign.  Presently asymptomatic.  Since patient has bilateral lower extremity DVT and edema with possible PE will need close monitoring further workup and more than 2  midnight stay.   DVT prophylaxis: Heparin  infusion. Code Status: Full code. Family Communication: Patient's wife at the bedside. Disposition Plan: Monitored bed. Consults called: Discussed with cardiologist about the bradycardia. Admission status: Observation.

## 2024-07-07 NOTE — ED Provider Notes (Signed)
 [facility] AT [facility] Provider Note   CSN: [id number] Arrival date & time: 07/07/24  9048     Patient presents with: Leg Pain   Richard Leon is a 63 y.o. male who was sent in by urgent care for evaluation of bilateral lower extremity swelling and pain.  Patient reports a history of left leg DVT and PE back in 2019 that landed him in the ICU.  Review of EMR shows that the patient had pulmonary emboli with right heart strain and hypoxia.  Review of EMR shows that patient had a DVT prior to that and was seen by hematology and told that he did not need to take it.  Patient also does not mention that he was diagnosed in 2023 with a left lower extremity DVT.  He is not on any anticoagulation.  He does report that he has been out of his blood pressure medications for the past 2 weeks though he has reportedly repeatedly contacted his primary care office to get it refilled.  Patient reports that Friday he was out doing a lot he noticed a red ring develop around his ankle and he had pain in his right leg.  He reports that when he woke up the next morning both of his legs were markedly edematous.  He denies ever having swelling in both of his legs before.  He does report that he has a history of postthrombotic syndrome and occasionally has some swelling in the left leg and his left leg is always a little bit bigger than the right but has never had significant swelling like this.  He denies any chest pain or shortness of breath.  He denies fever or chills.  He states that he wore compression stockings without improvement.  He denies unexplained weight loss, soaking night sweats, abdominal distention, orthopnea or PND.    Leg Pain      Prior to Admission medications   Medication Sig Start Date End Date Taking? Authorizing Provider  furosemide  (LASIX ) 20 MG tablet Take 1 tablet (20 mg total) by mouth daily. 10/20/17 10/20/18  Will Almarie MATSU, MD  lisinopril   (PRINIVIL ,ZESTRIL ) 5 MG tablet Take 2 tablets (10 mg total) by mouth daily. 10/21/17   Will Almarie MATSU, MD  potassium chloride  (K-DUR) 10 MEQ tablet Take 1 tablet (10 mEq total) by mouth daily. 10/20/17   Will Almarie MATSU, MD  RIVAROXABAN  (XARELTO ) VTE STARTER PACK (15 & 20 MG) Follow package directions: Take one 15mg  tablet by mouth twice a day. On day 22, switch to one 20mg  tablet once a day. Take with food. 05/07/22   Charlyn Sora, MD    Allergies: Patient has no known allergies.    Review of Systems  Updated Vital Signs BP (!) 148/78 (BP Location: Right Arm)   Pulse (!) 46   Temp 97.9 F (36.6 C) (Oral)   Resp 16   SpO2 100%   Physical Exam Vitals and nursing note reviewed.  Constitutional:      General: He is not in acute distress.    Appearance: He is well-developed. He is not diaphoretic.  HENT:     Head: Normocephalic and atraumatic.  Eyes:     General: No scleral icterus.    Conjunctiva/sclera: Conjunctivae normal.  Cardiovascular:     Rate and Rhythm: Regular rhythm. Bradycardia present.     Pulses:          Posterior tibial pulses are 2+ on the right side and 2+ on the  left side.     Heart sounds: Normal heart sounds.     Comments: Bilateral pitting edema from the ankle to the knee with some swelling in the feet.  The right lower extremity has some dilated superficial vessels with a little bit of petechiae on the skin it is warm to touch without blanching erythema or signs of cellulitis. Pulmonary:     Effort: Pulmonary effort is normal. No respiratory distress.     Breath sounds: Normal breath sounds.  Abdominal:     Palpations: Abdomen is soft.     Tenderness: There is no abdominal tenderness.  Musculoskeletal:     Cervical back: Normal range of motion and neck supple.     Right lower leg: 3+ Pitting Edema present.     Left lower leg: 3+ Pitting Edema present.  Skin:    General: Skin is warm and dry.  Neurological:     Mental Status: He is alert.   Psychiatric:        Behavior: Behavior normal.     (all labs ordered are listed, but only abnormal results are displayed) Labs Reviewed  PRO BRAIN NATRIURETIC PEPTIDE  CBC WITH DIFFERENTIAL/PLATELET  COMPREHENSIVE METABOLIC PANEL WITH GFR    EKG: None  Radiology: No results found.   .Critical Care  Performed by: Arloa Chroman, PA-C Authorized by: Arloa Chroman, PA-C   Critical care provider statement:    Critical care time (minutes):  90   Critical care time was exclusive of:  Separately billable procedures and treating other patients   Critical care was necessary to treat or prevent imminent or life-threatening deterioration of the following conditions: BL PE, BL DVT, Heparin  IV, Bradycardia.   Critical care was time spent personally by me on the following activities:  Development of treatment plan with patient or surrogate, discussions with consultants, evaluation of patient's response to treatment, examination of patient, ordering and review of laboratory studies, ordering and review of radiographic studies, ordering and performing treatments and interventions, pulse oximetry, re-evaluation of patient's condition, review of old charts, interpretation of cardiac output measurements and obtaining history from patient or surrogate   Care discussed with: admitting provider      Medications Ordered in the ED - No data to display  Clinical Course as of 07/08/24 1326  Sun Jul 07, 2024  2835 63 year old male who presents emergency department chief complaint of bilateral lower extremity edema.  New onset in the right side with a history of multiple DVTs and PEs in the past previously negative hypercoagulability panel except for a mildly isolated positive anticardiolipin IgM back in 2016.  Differential diagnosis includes venous insufficiency, stasis dermatitis, cellulitis, DVT, pelvic mass, CHF.  I have ordered bilateral DVT studies, CBC and complete metabolic panel.  Symptoms do  not appear consistent with cellulitis.  Workup pending.   [AH]  1230 Case discussed with Dr. Dwane.  Patient has bilateral nonocclusive DVTs throughout both of his legs.  He has some occlusive superficial thrombi.  I am going to touch base with hematology given the last assessment for hypercoagulable state was in 2016 and the patient has had multiple DVTs and a PE that ended in the ICU over the last 9 years to see if it is worth repeating a hypercoagulability panel. [AH]  1231 Pulse Rate(!): 43 [AH]  1231 Pulse Rate(!): 46 [AH]  1231 Patient is persistently bradycardic throughout his visit I reviewed the patient's flowsheets from previous visits.  Appears that he has previously had very few bradycardic  documented pulses.  At his last visit on 8/19 he had heart rate in the 50s however prior to that the majority of his heart rates were within normal range. [AH]  1310 Case discussed with Dr. Loretha who recommends outpatient follow-up with Dr. Timmy and lifelong anticoagulation.  I spoke with Dr. Gretta who recommends outpatient follow-up in the DVT clinic. Findings discussed with paitent and wife at bedside [AH]    Clinical Course User Index [AH] Arloa Chroman, PA-C                                 Medical Decision Making 63 year old gentleman who presents emergency department chief complaint of bilateral lower extremity swelling.  He is denying any chest pain or shortness of breath but has persistent bradycardia throughout his entire visit. Patient has had multiple DVTs over the past 10 years. Current recommendation from hematology is to maintain lifelong anticoagulation. I I ordered interpreted labs which shows mild anemia and thrombocytopenia of insignificant value. No evidence of heart failure based on troponin BNP .  Visualized and interpreted images including chest x-ray which is negative for acute finding venous ultrasound bilateral lower extremities which shows extensive bilateral deep  vein thromboses, and CT angiogram which is read as acute equivocal due to timing of the contrast load however shows clear evidence of bilateral filling defects on my interpretation which would suggest pulmonary embolus in the setting of bilateral lower extremity DVTs.  Given these findings patient started on heparin  and will be admitted to the hospital for further evaluation and monitoring especially given the fact that he has been persistently bradycardic in the 40s although asymptomatic.  Amount and/or Complexity of Data Reviewed Labs: ordered. Radiology: ordered.  Risk Prescription drug management. Decision regarding hospitalization.        Final diagnoses:  Acute pulmonary embolism, unspecified pulmonary embolism type, unspecified whether acute cor pulmonale present (HCC)  Acute deep vein thrombosis (DVT) of proximal vein of both lower extremities [facility])    ED Discharge Orders     None          Arloa Chroman, PA-C 07/08/24 1331    Dreama Longs, MD 07/15/24 [id number]

## 2024-07-07 NOTE — H&P (Incomplete)
 History and Physical    Richard Leon FMW:[id number] DOB: [birth year] DOA: 07/07/2024  Patient coming from: Home.  Chief Complaint: Lower extremity swelling.  HPI: Richard Leon is a 63 y.o. male with history of recurrent PE and DVT used to be on Xarelto  until 2022 with history of hypertension presents to the ER with complaints of lower extremity swelling.  Patient states he takes lisinopril /hydrochlorothiazide  for which she did not have refills and has not taken the medicine for last 2 weeks.  Over the last 5 days she noticed increasing swelling of both lower extremity with some erythema.  Denies fever or chills.  Patient did not have any chest pain or shortness of breath.  Due to worsening swelling patient presented to the ER.  ED Course: In the ER Dopplers of the lower extremity show nonocclusive thrombus within the bilateral femoral veins popliteal veins and right peroneal vein.  An occlusive thrombus within the left gastrocnemius vein.  CT angiogram of the chest was showing limited evaluation of contrast bolus.  Heterogeneous opacification within the left upper and left segmental pulmonary artery branches equivocal for pulmonary embolus given the limitation due to the timing of the contrast bolus.  No central or lobar pulmonary emboli are identified.  Patient was started on heparin  infusion and admitted for further observation.  EKG shows sinus bradycardia with heart rate around the 40s.  Patient states he has chronic bradycardia from being athletic.  Labs show hemoglobin of 12.2 platelets 122 troponin less than 15.  Pro BNP 169.   Review of Systems: As per HPI, rest all negative.   Past Medical History:  Diagnosis Date  . DVT (deep venous thrombosis) (HCC)   . Hypertension     Past Surgical History:  Procedure Laterality Date  . None       reports that he has never smoked. He has never used smokeless tobacco. He reports that he does not drink alcohol and does not use  drugs.  No Known Allergies  Family History  Problem Relation Age of Onset  . Arthritis Mother   . Hypertension Mother   . COPD Mother   . Kidney disease Sister   . Cancer Brother     Prior to Admission medications   Medication Sig Start Date End Date Taking? Authorizing Provider  furosemide  (LASIX ) 20 MG tablet Take 1 tablet (20 mg total) by mouth daily. 10/20/17 10/20/18  Will Almarie MATSU, MD  lisinopril  (PRINIVIL ,ZESTRIL ) 5 MG tablet Take 2 tablets (10 mg total) by mouth daily. 10/21/17   Will Almarie MATSU, MD  potassium chloride  (K-DUR) 10 MEQ tablet Take 1 tablet (10 mEq total) by mouth daily. 10/20/17   Will Almarie MATSU, MD  RIVAROXABAN  (XARELTO ) VTE STARTER PACK (15 & 20 MG) Follow package directions: Take one 15mg  tablet by mouth twice a day. On day 22, switch to one 20mg  tablet once a day. Take with food. 05/07/22   Charlyn Sora, MD    Physical Exam: Constitutional: Moderately built and nourished.   Vitals:   07/07/24 1540 07/07/24 1545 07/07/24 1629 07/07/24 2015  BP: (!) 171/83 (!) 160/84  (!) 154/87  Pulse: (!) 43 (!) 45  (!) 42  Resp: (!) 24 13  15   Temp: 98.1 F (36.7 C)     TempSrc: Oral     SpO2: 100% 100%  100%  Weight:   85.3 kg   Height:   5' 7.01 (1.702 m)    Eyes: Anicteric no pallor. ENMT: No discharge from  the ears eyes nose or mouth. Neck: No mass felt.  No neck rigidity.  No JVD appreciated. Respiratory: No rhonchi or crepitations. Cardiovascular: S1-S2 heard. Abdomen: Soft nontender bowel sound present. Musculoskeletal: Bilateral lower extremity edema up to the knee. Skin: Mild erythema of the lower extremities.  No signs of cellulitis. Neurologic: Alert awake oriented time place and person.  Moves all extremities. Psychiatric: Appears normal.  Normal affect.   Labs on Admission: I have personally reviewed following labs and imaging studies  CBC: Recent Labs  Lab 07/07/24 1144  WBC 4.2  NEUTROABS 2.2  HGB 12.2*  HCT 36.2*  MCV  90.7  PLT 122*   Basic Metabolic Panel: Recent Labs  Lab 07/07/24 1144  NA 142  K 4.6  CL 105  CO2 28  GLUCOSE 86  BUN 14  CREATININE 0.96  CALCIUM 10.0   GFR: Estimated Creatinine Clearance: 81.1 mL/min (by C-G formula based on SCr of 0.96 mg/dL). Liver Function Tests: Recent Labs  Lab 07/07/24 1144  AST 22  ALT 13  ALKPHOS 57  BILITOT 1.0  PROT 6.5  ALBUMIN 3.7   No results for input(s): LIPASE, AMYLASE in the last 168 hours. No results for input(s): AMMONIA in the last 168 hours. Coagulation Profile: No results for input(s): INR, PROTIME in the last 168 hours. Cardiac Enzymes: No results for input(s): CKTOTAL, CKMB, CKMBINDEX, TROPONINI in the last 168 hours. BNP (last 3 results) Recent Labs    07/07/24 1144  PROBNP 169.0   HbA1C: No results for input(s): HGBA1C in the last 72 hours. CBG: No results for input(s): GLUCAP in the last 168 hours. Lipid Profile: No results for input(s): CHOL, HDL, LDLCALC, TRIG, CHOLHDL, LDLDIRECT in the last 72 hours. Thyroid  Function Tests: No results for input(s): TSH, T4TOTAL, FREET4, T3FREE, THYROIDAB in the last 72 hours. Anemia Panel: No results for input(s): VITAMINB12, FOLATE, FERRITIN, TIBC, IRON, RETICCTPCT in the last 72 hours. Urine analysis:    Component Value Date/Time   COLORURINE YELLOW 01/23/2014 1345   APPEARANCEUR CLEAR 01/23/2014 1345   LABSPEC 1.015 01/23/2014 1345   PHURINE 5.5 01/23/2014 1345   GLUCOSEU NEGATIVE 01/23/2014 1345   HGBUR NEGATIVE 01/23/2014 1345   BILIRUBINUR NEGATIVE 01/23/2014 1345   KETONESUR NEGATIVE 01/23/2014 1345   PROTEINUR NEGATIVE 01/23/2014 1345   UROBILINOGEN 1.0 01/23/2014 1345   NITRITE NEGATIVE 01/23/2014 1345   LEUKOCYTESUR NEGATIVE 01/23/2014 1345   Sepsis Labs: @LABRCNTIP (procalcitonin:4,lacticidven:4) )No results found for this or any previous visit (from the past 240 hours).   Radiological Exams on  Admission: CT Angio Chest PE W and/or Wo Contrast Result Date: 07/07/2024 CLINICAL DATA:  Pulmonary embolism (PE) suspected, high prob, lower extremity edema, DVT EXAM: CT ANGIOGRAPHY CHEST WITH CONTRAST TECHNIQUE: Multidetector CT imaging of the chest was performed using the standard protocol during bolus administration of intravenous contrast. Multiplanar CT image reconstructions and MIPs were obtained to evaluate the vascular anatomy. RADIATION DOSE REDUCTION: This exam was performed according to the departmental dose-optimization program which includes automated exposure control, adjustment of the mA and/or kV according to patient size and/or use of iterative reconstruction technique. CONTRAST:  75mL OMNIPAQUE IOHEXOL 350 MG/ML SOLN COMPARISON:  10/18/2017, 07/07/2024 FINDINGS: Cardiovascular: There is suboptimal opacification of the segmental and subsegmental branches of the pulmonary artery due to timing of contrast bolus. There is somewhat heterogeneous opacification within the left upper and left lower lobe segmental vessels, equivocal for pulmonary embolus. No central or lobar emboli are identified. The heart is unremarkable without pericardial effusion.  Normal caliber of the thoracic aorta. Mediastinum/Nodes: No enlarged mediastinal, hilar, or axillary lymph nodes. Thyroid  gland, trachea, and esophagus demonstrate no significant findings. Lungs/Pleura: No acute airspace disease, effusion, or pneumothorax. Central airways are patent. Upper Abdomen: Calcified gallstones without cholecystitis. Musculoskeletal: No acute or destructive bony abnormalities. Reconstructed images demonstrate no additional findings. Review of the MIP images confirms the above findings. IMPRESSION: 1. Limited evaluation of the segmental and subsegmental pulmonary arterial branches due to timing of contrast bolus. 2. Somewhat heterogeneous opacification within the left upper and left lower lobe segmental pulmonary arterial  branches, equivocal for pulmonary embolus given limitations due to timing of contrast bolus described above. No central or lobar pulmonary emboli are identified. 3. No acute airspace disease. 4. Incidental cholelithiasis without cholecystitis. Electronically Signed   By: Ozell Daring M.D.   On: 07/07/2024 14:59   DG Chest Port 1 View Result Date: 07/07/2024 CLINICAL DATA:  Leg swelling. EXAM: PORTABLE CHEST 1 VIEW COMPARISON:  October 18, 2017 FINDINGS: The heart size and mediastinal contours are within normal limits. Mild atelectatic changes are suspected within the right lung base. No acute infiltrate, pleural effusion or pneumothorax is identified. Multilevel degenerative changes are present throughout the thoracic spine. IMPRESSION: No active disease. Electronically Signed   By: Suzen Dials M.D.   On: 07/07/2024 12:24   US  Venous Img Lower Bilateral (DVT) Result Date: 07/07/2024 CLINICAL DATA:  Bilateral lower extremity pain, redness and swelling. EXAM: BILATERAL LOWER EXTREMITY VENOUS DOPPLER ULTRASOUND TECHNIQUE: Gray-scale sonography with graded compression, as well as color Doppler and duplex ultrasound were performed to evaluate the lower extremity deep venous systems from the level of the common femoral vein and including the common femoral, femoral, profunda femoral, popliteal and calf veins including the posterior tibial, peroneal and gastrocnemius veins when visible. The superficial great saphenous vein was also interrogated. Spectral Doppler was utilized to evaluate flow at rest and with distal augmentation maneuvers in the common femoral, femoral and popliteal veins. COMPARISON:  None Available. FINDINGS: RIGHT LOWER EXTREMITY Common Femoral Vein: No evidence of thrombus. Normal compressibility, respiratory phasicity and response to augmentation. Saphenofemoral Junction: No evidence of thrombus. Normal compressibility and flow on color Doppler imaging. Profunda Femoral Vein: No  evidence of thrombus. Normal compressibility and flow on color Doppler imaging. Femoral Vein: There is evidence of nonocclusive thrombus within the mid to distal portion of the RIGHT femoral vein, with abnormal compressibility, respiratory phasicity and response to augmentation. Popliteal Vein: There is evidence of nonocclusive thrombus within the RIGHT popliteal vein, with abnormal compressibility, respiratory phasicity and response to augmentation. Calf Veins: The RIGHT posterior tibial vein is limited in visualization. There is evidence of nonocclusive thrombus within the RIGHT peroneal vein, with abnormal compressibility and flow on color Doppler imaging. Superficial Great Saphenous Vein: No evidence of thrombus. Normal compressibility. Venous Reflux:  None. Other Findings:  None. LEFT LOWER EXTREMITY Common Femoral Vein: No evidence of thrombus. Normal compressibility, respiratory phasicity and response to augmentation. Saphenofemoral Junction: No evidence of thrombus. Normal compressibility and flow on color Doppler imaging. Profunda Femoral Vein: No evidence of thrombus. Normal compressibility and flow on color Doppler imaging. Femoral Vein: There is evidence of nonocclusive thrombus within the distal aspect of the LEFT femoral vein, with abnormal compressibility, respiratory phasicity and response to augmentation. Popliteal Vein: There is evidence of nonocclusive thrombus within the LEFT popliteal vein, with abnormal compressibility, respiratory phasicity and response to augmentation. Calf Veins: The LEFT posterior tibial vein and LEFT peroneal vein are limited in  visualization. Superficial Great Saphenous Vein: No evidence of thrombus. Normal compressibility. Venous Reflux:  None. Other Findings: There is evidence of occlusive thrombus within the LEFT gastrocnemius vein. IMPRESSION: 1. Limited evaluation of the BILATERAL posterior tibial veins and LEFT peroneal vein. 2. Nonocclusive thrombus within the  BILATERAL femoral veins, BILATERAL popliteal veins and RIGHT peroneal vein. 3. Occlusive thrombus within the LEFT gastrocnemius vein. Electronically Signed   By: Suzen Dials M.D.   On: 07/07/2024 12:23    EKG: Independently reviewed.  Sinus bradycardia.  Assessment/Plan Principal Problem:   Acute bilateral deep vein thrombosis (DVT) of femoral veins (HCC) Active Problems:   HTN (hypertension)   Sinus bradycardia   DVT (deep venous thrombosis) (HCC)    Bilateral lower extremity DVT with possible PE with history of recurrent PE and DVT previously and also family history of patient's mother having DVT.  Patient started on a heparin  infusion likely will need to be on chronic anticoagulation given the recurrent episodes.  Patient is presently hemodynamically stable.  Will check 2D echo. Lower extremity edema likely from DVT.  Could also have some venous   DVT prophylaxis: *** Code Status: ***  Family Communication: ***  Disposition Plan: ***  Consults called: ***  Admission status: ***

## 2024-07-07 NOTE — ED Triage Notes (Signed)
 He c/o bilat. Lower leg non-traumatic pain and erythema for past few days. Seen at Urgent care, who recommended he come here. He is ambulatory and in no distress.

## 2024-07-07 NOTE — Progress Notes (Signed)
 PHARMACY - ANTICOAGULATION CONSULT NOTE  Pharmacy Consult for Heparin  Indication: pulmonary embolus  No Known Allergies  Patient Measurements: Height: 5' 7.01 (170.2 cm) Weight: 85.3 kg (188 lb) IBW/kg (Calculated) : 66.12 HEPARIN  DW (KG): 83.4  Vital Signs: Temp: 98.1 F (36.7 C) (10/19 1540) Temp Source: Oral (10/19 1540) BP: 160/84 (10/19 1545) Pulse Rate: 45 (10/19 1545)  Labs: Recent Labs    07/07/24 1144  HGB 12.2*  HCT 36.2*  PLT 122*  CREATININE 0.96    Estimated Creatinine Clearance: 81.1 mL/min (by C-G formula based on SCr of 0.96 mg/dL).   Medical History: Past Medical History:  Diagnosis Date   DVT (deep venous thrombosis) (HCC)    Hypertension     Medications:  Infusions:   heparin       Assessment: 28 YOM with a history of DVT. Found to have DVT and PE. Does not appear to still be on oral anticoagulation but ordering paired Heparin  Level/aPTT for first labs to evaluate for correlation. Goal of Therapy:  Heparin  level 0.3-0.7 units/ml aPTT 66-102 seconds Monitor platelets by anticoagulation protocol: Yes   Plan:  Give 6000 units bolus x 1 Start heparin  infusion at 1500 units/hr Check anti-Xa level in 8 hours and daily while on heparin  Continue to monitor H&H and platelets  Larraine Brazier, PharmD Clinical Pharmacist 07/07/2024  4:40 PM **Pharmacist phone directory can now be found on [email or web address] ([email or web address]).  Listed under [facility].

## 2024-07-07 NOTE — ED Triage Notes (Signed)
 Pt c/o lower leg pain, erythematous and swelling [id number]. Pt has 4+ swelling in LLE, 3+ swelling in RLE.

## 2024-07-07 NOTE — Progress Notes (Signed)
 Admit from [facility] 07/07/24-  63 y.o. male with a past medical history of hypertension and DVT presents for lower extremity swelling found to have bilateral DVT on ultrasound. CT chest equivocal findings given the timing of contrast. He had prior DVT, follows Dr. Rico, last hypercoagulable work up 2016 negative. Started on heparin  in [facility] ED, will likely need life long AC. Also noted to have bradycardia, not on beta blocker, need tele monitoring and echo. Accepted for Obs, medical telemetry floor.

## 2024-07-08 ENCOUNTER — Observation Stay (HOSPITAL_COMMUNITY)

## 2024-07-08 DIAGNOSIS — I824Y3 Acute embolism and thrombosis of unspecified deep veins of proximal lower extremity, bilateral: Secondary | ICD-10-CM

## 2024-07-08 DIAGNOSIS — I2609 Other pulmonary embolism with acute cor pulmonale: Secondary | ICD-10-CM

## 2024-07-08 DIAGNOSIS — D696 Thrombocytopenia, unspecified: Secondary | ICD-10-CM | POA: Insufficient documentation

## 2024-07-08 DIAGNOSIS — D649 Anemia, unspecified: Secondary | ICD-10-CM | POA: Insufficient documentation

## 2024-07-08 DIAGNOSIS — R001 Bradycardia, unspecified: Secondary | ICD-10-CM | POA: Diagnosis not present

## 2024-07-08 DIAGNOSIS — I82413 Acute embolism and thrombosis of femoral vein, bilateral: Secondary | ICD-10-CM | POA: Diagnosis not present

## 2024-07-08 LAB — CBC WITH DIFFERENTIAL/PLATELET
Abs Immature Granulocytes: 0 K/uL (ref 0.00–0.07)
Basophils Absolute: 0 K/uL (ref 0.0–0.1)
Basophils Relative: 1 %
Eosinophils Absolute: 0.1 K/uL (ref 0.0–0.5)
Eosinophils Relative: 4 %
HCT: 36.5 % — ABNORMAL LOW (ref 39.0–52.0)
Hemoglobin: 12 g/dL — ABNORMAL LOW (ref 13.0–17.0)
Immature Granulocytes: 0 %
Lymphocytes Relative: 40 %
Lymphs Abs: 1.6 K/uL (ref 0.7–4.0)
MCH: 29.7 pg (ref 26.0–34.0)
MCHC: 32.9 g/dL (ref 30.0–36.0)
MCV: 90.3 fL (ref 80.0–100.0)
Monocytes Absolute: 0.4 K/uL (ref 0.1–1.0)
Monocytes Relative: 10 %
Neutro Abs: 1.8 K/uL (ref 1.7–7.7)
Neutrophils Relative %: 45 %
Platelets: 111 K/uL — ABNORMAL LOW (ref 150–400)
RBC: 4.04 MIL/uL — ABNORMAL LOW (ref 4.22–5.81)
RDW: 14.8 % (ref 11.5–15.5)
WBC: 4 K/uL (ref 4.0–10.5)
nRBC: 0 % (ref 0.0–0.2)

## 2024-07-08 LAB — TROPONIN I (HIGH SENSITIVITY): Troponin I (High Sensitivity): 6 ng/L (ref <18)

## 2024-07-08 LAB — COMPREHENSIVE METABOLIC PANEL WITH GFR
ALT: 11 U/L (ref 0–44)
AST: 15 U/L (ref 15–41)
Albumin: 2.7 g/dL — ABNORMAL LOW (ref 3.5–5.0)
Alkaline Phosphatase: 43 U/L (ref 38–126)
Anion gap: 8 (ref 5–15)
BUN: 12 mg/dL (ref 8–23)
CO2: 26 mmol/L (ref 22–32)
Calcium: 9.1 mg/dL (ref 8.9–10.3)
Chloride: 107 mmol/L (ref 98–111)
Creatinine, Ser: 1 mg/dL (ref 0.61–1.24)
GFR, Estimated: >60 mL/min (ref >60)
Glucose, Bld: 94 mg/dL (ref 70–99)
Potassium: 3.6 mmol/L (ref 3.5–5.1)
Sodium: 141 mmol/L (ref 135–145)
Total Bilirubin: 1.3 mg/dL — ABNORMAL HIGH (ref 0.0–1.2)
Total Protein: 5.5 g/dL — ABNORMAL LOW (ref 6.5–8.1)

## 2024-07-08 LAB — ECHOCARDIOGRAM COMPLETE
AR max vel: 2.98 cm2
AV Area VTI: 3.21 cm2
AV Area mean vel: 3.1 cm2
AV Mean grad: 4 mmHg
AV Peak grad: 7.4 mmHg
Ao pk vel: 1.36 m/s
Area-P 1/2: 3.66 cm2
Height: 67.008 in
S' Lateral: 3.3 cm
Weight: 3008 [oz_av]

## 2024-07-08 LAB — IRON AND TIBC
Iron: 67 ug/dL (ref 45–182)
Saturation Ratios: 24 % (ref 17.9–39.5)
TIBC: 277 ug/dL (ref 250–450)
UIBC: 210 ug/dL

## 2024-07-08 LAB — RETICULOCYTES
Immature Retic Fract: 10.3 % (ref 2.3–15.9)
RBC.: 4 MIL/uL — ABNORMAL LOW (ref 4.22–5.81)
Retic Count, Absolute: 46 K/uL (ref 19.0–186.0)
Retic Ct Pct: 1.2 % (ref 0.4–3.1)

## 2024-07-08 LAB — VITAMIN B12: Vitamin B-12: 205 pg/mL (ref 180–914)

## 2024-07-08 LAB — HEPARIN LEVEL (UNFRACTIONATED)
Heparin Unfractionated: 0.38 [IU]/mL (ref 0.30–0.70)
Heparin Unfractionated: 0.42 [IU]/mL (ref 0.30–0.70)
Heparin Unfractionated: 0.6 [IU]/mL (ref 0.30–0.70)

## 2024-07-08 LAB — HIV ANTIBODY (ROUTINE TESTING W REFLEX): HIV Screen 4th Generation wRfx: NONREACTIVE

## 2024-07-08 LAB — FERRITIN: Ferritin: 46 ng/mL (ref 24–336)

## 2024-07-08 LAB — FOLATE: Folate: >20 ng/mL (ref >5.9)

## 2024-07-08 LAB — APTT: aPTT: 151 s — ABNORMAL HIGH (ref 24–36)

## 2024-07-08 MED ORDER — VITAMIN B-12 1000 MCG PO TABS
1000.0000 ug | ORAL_TABLET | Freq: Every day | ORAL | Status: DC
  Administered 2024-07-09: 1000 ug via ORAL
  Filled 2024-07-08: qty 1

## 2024-07-08 MED ORDER — CYANOCOBALAMIN 1000 MCG/ML IJ SOLN
1000.0000 ug | Freq: Once | INTRAMUSCULAR | Status: AC
  Administered 2024-07-08: 1000 ug via INTRAMUSCULAR
  Filled 2024-07-08: qty 1

## 2024-07-08 MED ORDER — INFLUENZA VIRUS VACC SPLIT PF (FLUZONE) 0.5 ML IM SUSY
0.5000 mL | PREFILLED_SYRINGE | INTRAMUSCULAR | Status: AC | PRN
  Administered 2024-07-09: 0.5 mL via INTRAMUSCULAR
  Filled 2024-07-08: qty 0.5

## 2024-07-08 NOTE — Progress Notes (Signed)
 PHARMACY - ANTICOAGULATION CONSULT NOTE  Pharmacy Consult for heparin  Indication: pulmonary embolus  No Known Allergies  Patient Measurements: Height: 5' 7.01 (170.2 cm) Weight: 85.3 kg (188 lb) IBW/kg (Calculated) : 66.12 HEPARIN  DW (KG): 83.4  Vital Signs: Temp: 97.8 F (36.6 C) (10/20 0830) Temp Source: Oral (10/20 0830) BP: 137/84 (10/20 0830) Pulse Rate: 50 (10/20 0815)  Labs: Recent Labs    07/07/24 1144 07/07/24 2143 07/07/24 2355 07/08/24 0319 07/08/24 0847  HGB 12.2*  --   --  12.0*  --   HCT 36.2*  --   --  36.5*  --   PLT 122*  --   --  111*  --   APTT  --   --  151*  --   --   HEPARINUNFRC  --   --  0.42 0.38 0.60  CREATININE 0.96  --   --  1.00  --   TROPONINIHS  --  6 6  --   --     Estimated Creatinine Clearance: 77.9 mL/min (by C-G formula based on SCr of 1 mg/dL).   Medical History: Past Medical History:  Diagnosis Date   DVT (deep venous thrombosis) (HCC)    Hypertension     Medications:  Medications Prior to Admission  Medication Sig Dispense Refill Last Dose/Taking   furosemide  (LASIX ) 20 MG tablet Take 1 tablet (20 mg total) by mouth daily. 30 tablet 11    lisinopril  (PRINIVIL ,ZESTRIL ) 5 MG tablet Take 2 tablets (10 mg total) by mouth daily. 60 tablet 1    potassium chloride  (K-DUR) 10 MEQ tablet Take 1 tablet (10 mEq total) by mouth daily. 30 tablet 0    RIVAROXABAN  (XARELTO ) VTE STARTER PACK (15 & 20 MG) Follow package directions: Take one 15mg  tablet by mouth twice a day. On day 22, switch to one 20mg  tablet once a day. Take with food. 51 each 0     Assessment: 13 YOM with a history of DVT. Found to have DVT and PE. Does not appear to still be on oral anticoagulation PTA.  Heparin  level remains therapeutic at 0.6 this morning. No issues with infusion or bleeding reported. CBC stable   Goal of Therapy:  Heparin  level 0.3-0.7 units/ml Monitor platelets by anticoagulation protocol: Yes   Plan:  Continue heparin  infusion at 1500  units/hr Check anti-Xa level daily while on heparin  Continue to monitor H&H and platelets   Thank you for allowing pharmacy to be a part of this patient's care.   Bascom JAYSON Louder, PharmD 07/08/2024 11:27 AM  **Pharmacist phone directory can be found on [email or web address] listed under [facility]**

## 2024-07-08 NOTE — Progress Notes (Signed)
 PROGRESS NOTE  Richard Leon FMW:[id number] DOB: Dec 02, 1960   PCP: Shelda Atlas, MD  Patient is from: Home.  Very active at baseline.  DOA: 07/07/2024 LOS: 0  Chief complaints Chief Complaint  Patient presents with   Leg Pain     Brief Narrative / Interim history: 63 year old M with PMH of prior PE/DVT previously on Xarelto , physiologic bradycardia and HTN presented to ED with bilateral lower extremity edema, found to have bilateral lower extremity DVT on lower extremity Dopplers.  CT angio chest without central occlusive PE but limited evaluation due to timing of contrast bolus.  Patient dose of Xarelto  since 2022.  Started on IV heparin .  No recent long travel and immobility.  Actually works out about 2 hours a day.  Up-to-date on colonoscopy.  Never smoked cigarettes.  Family history of recurrent DVT in mother.  Patient used to follow with Dr. Timmy with negative hypercoagulable workup in 2016.  Subjective: Seen and examined earlier this morning.  No major events overnight or this morning.  No major events overnight or this morning.  Still with BLE swelling but no pain.  No chest pain or shortness of breath.  Reportedly up-to-date on colonoscopy   Assessment and plan: Bilateral lower extremity DVT: CT angio without central PE but not able to rule out peripheral PEs.  Prior history of PE/DVT with negative hypercoagulable labs in 2016.  Off Xarelto  since 2022.  Family history of recurrent DVT in mother.  Serial troponin and BNP negative. -Continue IV heparin  at rest for 48 hours before transitioning to DOAC -Follow echocardiogram -Needs outpatient follow-up with hematology  Essential hypertension: BP within acceptable range. -Continue holding lisinopril  and HCTZ pending echocardiogram  Physiologic bradycardia: Positive chronotropic effect to exertion.  HR went from 50-66 with minimal exertion.  Not symptomatic.  Venous insufficiency - Encourage leg elevation - May  benefit from vascular study outpatient.  Vitamin B12 deficiency - Vitamin B12 injection followed by p.o.   Body mass index is 29.44 kg/m.           DVT prophylaxis:  On full dose anticoagulation  Code Status: Full code Family Communication: None at bedside Level of care: Telemetry Medical Status is: Observation The patient will require care spanning > 2 midnights and should be moved to inpatient because: Acute DVT and possible PE on IV heparin    Final disposition: Home   55 minutes with more than 50% spent in reviewing records, counseling patient/family and coordinating care.  Consultants:  None  Procedures: None  Microbiology summarized: None  Objective: Vitals:   07/07/24 2302 07/08/24 0305 07/08/24 0815 07/08/24 0830  BP: 118/81 126/69 138/80 137/84  Pulse: 69 (!) 48 (!) 50   Resp: 19 19 13    Temp: 98.1 F (36.7 C) 97.8 F (36.6 C) 98.1 F (36.7 C) 97.8 F (36.6 C)  TempSrc: Oral Oral Oral Oral  SpO2: 98% 99% 100%   Weight:      Height:        Examination:  GENERAL: No apparent distress.  Nontoxic. HEENT: MMM.  Vision and hearing grossly intact.  NECK: Supple.  No apparent JVD.  RESP:  No IWOB.  Fair aeration bilaterally. CVS: Bradycardic to 50s but increased to 66 with minimal exertion.  Heart sounds normal.  ABD/GI/GU: BS+. Abd soft, NTND.  MSK/EXT:  Moves extremities.  BLE edema/venous insufficiency SKIN: As above. NEURO: AA.  Oriented appropriately.  No apparent focal neuro deficit. PSYCH: Calm. Normal affect.   Sch Meds:  Scheduled Meds:  [  START ON 07/09/2024] vitamin B-12  1,000 mcg Oral Daily   Continuous Infusions:  heparin  1,500 Units/hr (07/08/24 0550)   PRN Meds:.acetaminophen  **OR** acetaminophen , influenza vac split trivalent PF  Antimicrobials: Anti-infectives (From admission, onward)    None        I have personally reviewed the following labs and images: CBC: Recent Labs  Lab 07/07/24 1144 07/08/24 0319   WBC 4.2 4.0  NEUTROABS 2.2 1.8  HGB 12.2* 12.0*  HCT 36.2* 36.5*  MCV 90.7 90.3  PLT 122* 111*   BMP &GFR Recent Labs  Lab 07/07/24 1144 07/08/24 0319  NA 142 141  K 4.6 3.6  CL 105 107  CO2 28 26  GLUCOSE 86 94  BUN 14 12  CREATININE 0.96 1.00  CALCIUM 10.0 9.1   Estimated Creatinine Clearance: 77.9 mL/min (by C-G formula based on SCr of 1 mg/dL). Liver & Pancreas: Recent Labs  Lab 07/07/24 1144 07/08/24 0319  AST 22 15  ALT 13 11  ALKPHOS 57 43  BILITOT 1.0 1.3*  PROT 6.5 5.5*  ALBUMIN 3.7 2.7*   No results for input(s): LIPASE, AMYLASE in the last 168 hours. No results for input(s): AMMONIA in the last 168 hours. Diabetic: No results for input(s): HGBA1C in the last 72 hours. No results for input(s): GLUCAP in the last 168 hours. Cardiac Enzymes: No results for input(s): CKTOTAL, CKMB, CKMBINDEX, TROPONINI in the last 168 hours. Recent Labs    07/07/24 1144  PROBNP 169.0   Coagulation Profile: No results for input(s): INR, PROTIME in the last 168 hours. Thyroid  Function Tests: Recent Labs    07/07/24 2143  TSH 1.117   Lipid Profile: No results for input(s): CHOL, HDL, LDLCALC, TRIG, CHOLHDL, LDLDIRECT in the last 72 hours. Anemia Panel: Recent Labs    07/08/24 0319  VITAMINB12 205  FOLATE >20.0  FERRITIN 46  TIBC 277  IRON 67  RETICCTPCT 1.2   Urine analysis:    Component Value Date/Time   COLORURINE YELLOW 01/23/2014 1345   APPEARANCEUR CLEAR 01/23/2014 1345   LABSPEC 1.015 01/23/2014 1345   PHURINE 5.5 01/23/2014 1345   GLUCOSEU NEGATIVE 01/23/2014 1345   HGBUR NEGATIVE 01/23/2014 1345   BILIRUBINUR NEGATIVE 01/23/2014 1345   KETONESUR NEGATIVE 01/23/2014 1345   PROTEINUR NEGATIVE 01/23/2014 1345   UROBILINOGEN 1.0 01/23/2014 1345   NITRITE NEGATIVE 01/23/2014 1345   LEUKOCYTESUR NEGATIVE 01/23/2014 1345   Sepsis Labs: Invalid input(s): PROCALCITONIN, LACTICIDVEN  Microbiology: No  results found for this or any previous visit (from the past 240 hours).  Radiology Studies: CT Angio Chest PE W and/or Wo Contrast Result Date: 07/07/2024 CLINICAL DATA:  Pulmonary embolism (PE) suspected, high prob, lower extremity edema, DVT EXAM: CT ANGIOGRAPHY CHEST WITH CONTRAST TECHNIQUE: Multidetector CT imaging of the chest was performed using the standard protocol during bolus administration of intravenous contrast. Multiplanar CT image reconstructions and MIPs were obtained to evaluate the vascular anatomy. RADIATION DOSE REDUCTION: This exam was performed according to the departmental dose-optimization program which includes automated exposure control, adjustment of the mA and/or kV according to patient size and/or use of iterative reconstruction technique. CONTRAST:  75mL OMNIPAQUE IOHEXOL 350 MG/ML SOLN COMPARISON:  10/18/2017, 07/07/2024 FINDINGS: Cardiovascular: There is suboptimal opacification of the segmental and subsegmental branches of the pulmonary artery due to timing of contrast bolus. There is somewhat heterogeneous opacification within the left upper and left lower lobe segmental vessels, equivocal for pulmonary embolus. No central or lobar emboli are identified. The heart is unremarkable without  pericardial effusion. Normal caliber of the thoracic aorta. Mediastinum/Nodes: No enlarged mediastinal, hilar, or axillary lymph nodes. Thyroid  gland, trachea, and esophagus demonstrate no significant findings. Lungs/Pleura: No acute airspace disease, effusion, or pneumothorax. Central airways are patent. Upper Abdomen: Calcified gallstones without cholecystitis. Musculoskeletal: No acute or destructive bony abnormalities. Reconstructed images demonstrate no additional findings. Review of the MIP images confirms the above findings. IMPRESSION: 1. Limited evaluation of the segmental and subsegmental pulmonary arterial branches due to timing of contrast bolus. 2. Somewhat heterogeneous  opacification within the left upper and left lower lobe segmental pulmonary arterial branches, equivocal for pulmonary embolus given limitations due to timing of contrast bolus described above. No central or lobar pulmonary emboli are identified. 3. No acute airspace disease. 4. Incidental cholelithiasis without cholecystitis. Electronically Signed   By: Ozell Daring M.D.   On: 07/07/2024 14:59      Luanna Weesner T. Doyle Tegethoff [facility]  If 7PM-7AM, please contact night-coverage [email or web address] 07/08/2024, 12:21 PM

## 2024-07-08 NOTE — Progress Notes (Signed)
 PHARMACY - ANTICOAGULATION  Pharmacy Consult for Heparin  Indication: pulmonary embolus Brief A/P: Heparin  level within goal range Continue Heparin  at current rate  No Known Allergies  Patient Measurements: Height: 5' 7.01 (170.2 cm) Weight: 85.3 kg (188 lb) IBW/kg (Calculated) : 66.12 HEPARIN  DW (KG): 83.4  Vital Signs: Temp: 98.1 F (36.7 C) (10/19 2302) Temp Source: Oral (10/19 2302) BP: 118/81 (10/19 2302) Pulse Rate: 69 (10/19 2302)  Labs: Recent Labs    07/07/24 1144 07/07/24 2143 07/07/24 2355  HGB 12.2*  --   --   HCT 36.2*  --   --   PLT 122*  --   --   APTT  --   --  151*  HEPARINUNFRC  --   --  0.42  CREATININE 0.96  --   --   TROPONINIHS  --  6 6    Estimated Creatinine Clearance: 81.1 mL/min (by C-G formula based on SCr of 0.96 mg/dL).   Assessment: 63 y.o. male with PE for heparin   Goal of Therapy:  Heparin  level 0.3-0.7 units/ml aPTT 66-102 seconds Monitor platelets by anticoagulation protocol: Yes   Plan:  No change to heparin  for now Follow-up am labs.  Cathlyn Arrant, PharmD, BCPS

## 2024-07-08 NOTE — Plan of Care (Signed)
  Problem: Education: Goal: Knowledge of General Education information will improve Description: Including pain rating scale, medication(s)/side effects and non-pharmacologic comfort measures Outcome: Progressing   Problem: Health Behavior/Discharge Planning: Goal: Ability to manage health-related needs will improve Outcome: Progressing   Problem: Clinical Measurements: Goal: Ability to maintain clinical measurements within normal limits will improve Outcome: Progressing Goal: Will remain free from infection Outcome: Progressing Goal: Diagnostic test results will improve Outcome: Progressing Goal: Respiratory complications will improve Outcome: Progressing Goal: Cardiovascular complication will be avoided Outcome: Progressing   Problem: Activity: Goal: Risk for activity intolerance will decrease Outcome: Progressing   Problem: Nutrition: Goal: Adequate nutrition will be maintained Outcome: Progressing   Problem: Coping: Goal: Level of anxiety will decrease Outcome: Progressing   Problem: Elimination: Goal: Will not experience complications related to bowel motility Outcome: Progressing Goal: Will not experience complications related to urinary retention Outcome: Progressing   Problem: Pain Managment: Goal: General experience of comfort will improve and/or be controlled Outcome: Progressing   Problem: Safety: Goal: Ability to remain free from injury will improve Outcome: Progressing   Problem: Skin Integrity: Goal: Risk for impaired skin integrity will decrease Outcome: Progressing   Problem: Consults Goal: Venous Thromboembolism Patient Education Description: See Patient Education Module for education specifics. Outcome: Progressing Goal: Diagnosis - Venous Thromboembolism (VTE) Description: Choose a selection Outcome: Progressing Note: DVT (Deep Vein Thrombosis) Goal: Pharmacy Consult for anticoagulation Outcome: Progressing Goal: Skin Care Protocol  Initiated - if Braden Score 18 or less Description: If consults are not indicated, leave blank or document N/A Outcome: Progressing Goal: Nutrition Consult-if indicated Outcome: Progressing Goal: Diabetes Guidelines if Diabetic/Glucose > 140 Description: If diabetic or lab glucose is > 140 mg/dl - Initiate Diabetes/Hyperglycemia Guidelines & Document Interventions  Outcome: Progressing   Problem: Phase I Progression Outcomes Goal: Pain controlled with appropriate interventions Outcome: Progressing Goal: Dyspnea controlled at rest (PE) Outcome: Progressing Goal: Tolerating diet Outcome: Progressing Goal: Initial discharge plan identified Outcome: Progressing Goal: Voiding-avoid urinary catheter unless indicated Outcome: Progressing Goal: Hemodynamically stable Outcome: Progressing Goal: Other Phase I Outcomes/Goals Outcome: Progressing   Problem: Phase II Progression Outcomes Goal: Therapeutic drug levels for anticoagulation Outcome: Progressing Goal: 02 sats trending upward/stable (PE) Outcome: Progressing Goal: Discharge plan established Outcome: Progressing Goal: Tolerating diet Outcome: Progressing Goal: Other Phase II Outcomes/Goals Outcome: Progressing   Problem: Phase III Progression Outcomes Goal: 02 sats stabilized Outcome: Progressing Goal: Activity at appropriate level-compared to baseline Description: (UP IN CHAIR FOR HEMODIALYSIS) Outcome: Progressing Goal: Discharge plan remains appropriate-arrangements made Outcome: Progressing Goal: Other Phase III Outcomes/Goals Outcome: Progressing   Problem: Discharge Progression Outcomes Goal: Barriers To Progression Addressed/Resolved Outcome: Progressing Goal: Discharge plan in place and appropriate Outcome: Progressing Goal: Pain controlled with appropriate interventions Outcome: Progressing Goal: Hemodynamically stable Outcome: Progressing Goal: Complications resolved/controlled Outcome:  Progressing Goal: Tolerating diet Outcome: Progressing Goal: Activity appropriate for discharge plan Outcome: Progressing Goal: Other Discharge Outcomes/Goals Outcome: Progressing

## 2024-07-08 NOTE — Plan of Care (Signed)
  Problem: Education: Goal: Knowledge of General Education information will improve Description: Including pain rating scale, medication(s)/side effects and non-pharmacologic comfort measures Outcome: Progressing   Problem: Health Behavior/Discharge Planning: Goal: Ability to manage health-related needs will improve Outcome: Progressing   Problem: Clinical Measurements: Goal: Ability to maintain clinical measurements within normal limits will improve Outcome: Progressing Goal: Will remain free from infection Outcome: Progressing Goal: Respiratory complications will improve Outcome: Progressing Goal: Cardiovascular complication will be avoided Outcome: Progressing   Problem: Activity: Goal: Risk for activity intolerance will decrease Outcome: Progressing   Problem: Nutrition: Goal: Adequate nutrition will be maintained Outcome: Progressing   Problem: Coping: Goal: Level of anxiety will decrease Outcome: Progressing

## 2024-07-09 ENCOUNTER — Other Ambulatory Visit (HOSPITAL_COMMUNITY): Payer: Self-pay

## 2024-07-09 ENCOUNTER — Telehealth (HOSPITAL_COMMUNITY): Payer: Self-pay | Admitting: Pharmacy Technician

## 2024-07-09 DIAGNOSIS — D696 Thrombocytopenia, unspecified: Secondary | ICD-10-CM | POA: Diagnosis not present

## 2024-07-09 DIAGNOSIS — I824Y3 Acute embolism and thrombosis of unspecified deep veins of proximal lower extremity, bilateral: Secondary | ICD-10-CM | POA: Diagnosis not present

## 2024-07-09 DIAGNOSIS — I82413 Acute embolism and thrombosis of femoral vein, bilateral: Secondary | ICD-10-CM | POA: Diagnosis not present

## 2024-07-09 DIAGNOSIS — R001 Bradycardia, unspecified: Secondary | ICD-10-CM | POA: Diagnosis not present

## 2024-07-09 LAB — CBC
HCT: 36.2 % — ABNORMAL LOW (ref 39.0–52.0)
Hemoglobin: 12 g/dL — ABNORMAL LOW (ref 13.0–17.0)
MCH: 30.1 pg (ref 26.0–34.0)
MCHC: 33.1 g/dL (ref 30.0–36.0)
MCV: 90.7 fL (ref 80.0–100.0)
Platelets: 116 K/uL — ABNORMAL LOW (ref 150–400)
RBC: 3.99 MIL/uL — ABNORMAL LOW (ref 4.22–5.81)
RDW: 14.7 % (ref 11.5–15.5)
WBC: 3.9 K/uL — ABNORMAL LOW (ref 4.0–10.5)
nRBC: 0 % (ref 0.0–0.2)

## 2024-07-09 LAB — HEPARIN LEVEL (UNFRACTIONATED): Heparin Unfractionated: 0.46 [IU]/mL (ref 0.30–0.70)

## 2024-07-09 MED ORDER — APIXABAN 5 MG PO TABS
10.0000 mg | ORAL_TABLET | Freq: Two times a day (BID) | ORAL | Status: DC
  Administered 2024-07-09: 10 mg via ORAL
  Filled 2024-07-09: qty 4

## 2024-07-09 MED ORDER — APIXABAN (ELIQUIS) VTE STARTER PACK (10MG AND 5MG)
ORAL_TABLET | ORAL | 0 refills | Status: DC
  Filled 2024-07-09: qty 74, 30d supply, fill #0

## 2024-07-09 MED ORDER — APIXABAN 5 MG PO TABS
5.0000 mg | ORAL_TABLET | Freq: Two times a day (BID) | ORAL | Status: DC
Start: 2024-07-16 — End: 2024-07-09

## 2024-07-09 MED ORDER — APIXABAN 5 MG PO TABS: 5.0000 mg | ORAL_TABLET | Freq: Two times a day (BID) | ORAL | 1 refills | Status: DC

## 2024-07-09 MED ORDER — LISINOPRIL-HYDROCHLOROTHIAZIDE 20-25 MG PO TABS
1.0000 | ORAL_TABLET | Freq: Every day | ORAL | 3 refills | Status: AC
  Filled 2024-07-09: qty 30, 30d supply, fill #0

## 2024-07-09 NOTE — Discharge Summary (Signed)
 Physician Discharge Summary  SANTONIO SPEAKMAN FMW:[id number] DOB: [birth year] DOA: 07/07/2024  PCP: Shelda Atlas, MD  Admit date: 07/07/2024 Discharge date: 07/09/24  Admitted From: Home Disposition: Home Recommendations for Outpatient Follow-up:  Outpatient follow-up with PCP and hematology in 1 to 2 weeks Check blood pressure, CMP and CBC at follow-up Please follow up on the following pending results: None   Home Health: No new need identified Equipment/Devices: No new need identified  Discharge Condition: Stable CODE STATUS: Full code Diet Orders (From admission, onward)     Start     Ordered   07/09/24 0000  Diet - low sodium heart healthy        07/09/24 0926   07/07/24 2204  Diet Heart Room service appropriate? Yes; Fluid consistency: Thin  Diet effective now       Question Answer Comment  Room service appropriate? Yes   Fluid consistency: Thin      07/07/24 2203              Hospital course 63 year old M with PMH of prior PE/DVT previously on Xarelto , physiologic bradycardia and HTN presented to ED with bilateral lower extremity edema, found to have bilateral lower extremity DVT on lower extremity Dopplers.  CT angio chest without central occlusive PE but limited evaluation due to timing of contrast bolus.  Patient dose of Xarelto  since 2022.  Started on IV heparin .  No recent long travel and immobility.  Actually works out about 2 hours a day.  Up-to-date on colonoscopy.  Never smoked cigarettes.  Family history of recurrent DVT in mother.  Patient used to follow with Dr. Timmy with negative hypercoagulable workup in 2016.  The next day, patient remained stable.  TTE without significant finding.  Continued on IV heparin .   On the day of discharge, patient remained stable.  Anticoagulated with IV heparin  for 48 hours and the transition to starter pack Eliquis.  He is discharged on starter pack Eliquis.  Outpatient follow-up with PCP and hematology in 1 to 2  weeks recommended.   See individual problem list below for more.   Problems addressed during this hospitalization Bilateral lower extremity DVT: CT angio without central PE but not able to rule out peripheral PEs.  Prior history of PE/DVT with negative hypercoagulable labs in 2016.  Off Xarelto  since 2022.  Family history of recurrent DVT in mother.  Serial troponin and BNP negative.  TTE without significant finding.  Anticoagulated with IV heparin  and discharged on starter pack Eliquis.   Essential hypertension: BP within acceptable range. -Renewed his prescription for lisinopril /HCTZ.   Physiologic bradycardia: Positive chronotropic effect to exertion.  HR went from 50-66 with minimal exertion.  Not symptomatic.   Venous insufficiency - Encourage leg elevation - Can send referral to vascular surgery   Vitamin B12 deficiency - May need monthly vitamin B12 injection  Body mass index is 29.44 kg/m.           Consultations: None  Time spent 35  minutes  Vital signs Vitals:   07/08/24 2323 07/09/24 0303 07/09/24 0805 07/09/24 0811  BP: 117/74 (!) 110/57  129/78  Pulse: (!) 45 (!) 45 (!) 52 (!) 48  Temp: 97.8 F (36.6 C) 98.3 F (36.8 C)  97.6 F (36.4 C)  Resp: 20 18 16 19   Height:      Weight:      SpO2: 99% 100% 99% 100%  TempSrc: Oral Oral  Oral  BMI (Calculated):  Discharge exam GENERAL: No apparent distress.  Nontoxic. HEENT: MMM.  Vision and hearing grossly intact.  NECK: Supple.  No apparent JVD.  RESP:  No IWOB.  Fair aeration bilaterally. CVS: Bradycardic to 50s but increased to 66 with minimal exertion.  Heart sounds normal.  ABD/GI/GU: BS+. Abd soft, NTND.  MSK/EXT:  Moves extremities.  BLE edema/venous insufficiency SKIN: As above. NEURO: AA.  Oriented appropriately.  No apparent focal neuro deficit. PSYCH: Calm. Normal affect.   Discharge Instructions Discharge Instructions     Diet - low sodium heart healthy   Complete by: As  directed    Discharge instructions   Complete by: As directed    It has been a pleasure taking care of you!  You were hospitalized acute deep venous thrombosis (blood clot in your legs) for which you have been started on blood thinner.  We are discharging you on this medication.  Please follow-up with your primary care doctor and hematologist in 1 to 2 weeks or sooner if needed.  Avoid any over-the-counter pain medications other than plain Tylenol  while taking Eliquis.   Take care,   Increase activity slowly   Complete by: As directed       Allergies as of 07/09/2024   No Known Allergies      Medication List     STOP taking these medications    aspirin  EC 81 MG tablet       TAKE these medications    Apixaban Starter Pack (10mg  and 5mg ) Commonly known as: ELIQUIS STARTER PACK Take as directed on package: start with two-5mg  tablets twice daily for 7 days. On day 8, switch to one-5mg  tablet twice daily.   apixaban 5 MG Tabs tablet Commonly known as: ELIQUIS Take 1 tablet (5 mg total) by mouth 2 (two) times daily. Start when you finish starter pack Start taking on: August 08, 2024   lisinopril -hydrochlorothiazide  20-25 MG tablet Commonly known as: ZESTORETIC Take 1 tablet by mouth daily.         Procedures/Studies:   ECHOCARDIOGRAM COMPLETE Result Date: 07/08/2024    ECHOCARDIOGRAM REPORT   Patient Name:   Richard Leon Date of Exam: 07/08/2024 Medical Rec #:  [id number]         Height:       67.0 in Accession #:    [id number]        Weight:       188.0 lb Date of Birth:  [birth year]         BSA:          1.970 m Patient Age:    63 years          BP:           435/75 mmHg Patient Gender: M                 HR:           55 bpm. Exam Location:  Inpatient Procedure: 2D Echo, Cardiac Doppler and Color Doppler (Both Spectral and Color            Flow Doppler were utilized during procedure). Indications:    Pulmonary Embolus I26.09  History:        Patient has no  prior history of Echocardiogram examinations.                 Risk Factors:Hypertension.  Sonographer:    Jayson Gaskins Referring Phys: [phone] ARSHAD N KAKRAKANDY IMPRESSIONS  1. Left ventricular ejection fraction, by  estimation, is 60 to 65%. The left ventricle has normal function. The left ventricle has no regional wall motion abnormalities. Left ventricular diastolic parameters were normal.  2. Right ventricular systolic function is normal. The right ventricular size is normal. Tricuspid regurgitation signal is inadequate for assessing PA pressure.  3. Left atrial size was mildly dilated.  4. The mitral valve is normal in structure. Trivial mitral valve regurgitation. No evidence of mitral stenosis.  5. The aortic valve is tricuspid. Aortic valve regurgitation is not visualized. No aortic stenosis is present.  6. The inferior vena cava is dilated in size with <50% respiratory variability, suggesting right atrial pressure of 15 mmHg. FINDINGS  Left Ventricle: Left ventricular ejection fraction, by estimation, is 60 to 65%. The left ventricle has normal function. The left ventricle has no regional wall motion abnormalities. The left ventricular internal cavity size was normal in size. There is  no left ventricular hypertrophy. Left ventricular diastolic parameters were normal. Right Ventricle: The right ventricular size is normal. No increase in right ventricular wall thickness. Right ventricular systolic function is normal. Tricuspid regurgitation signal is inadequate for assessing PA pressure. Left Atrium: Left atrial size was mildly dilated. Right Atrium: Right atrial size was normal in size. Pericardium: There is no evidence of pericardial effusion. Mitral Valve: The mitral valve is normal in structure. Trivial mitral valve regurgitation. No evidence of mitral valve stenosis. Tricuspid Valve: The tricuspid valve is normal in structure. Tricuspid valve regurgitation is trivial. No evidence of tricuspid stenosis.  Aortic Valve: The aortic valve is tricuspid. Aortic valve regurgitation is not visualized. No aortic stenosis is present. Aortic valve mean gradient measures 4.0 mmHg. Aortic valve peak gradient measures 7.4 mmHg. Aortic valve area, by VTI measures 3.21 cm. Pulmonic Valve: The pulmonic valve was grossly normal. Pulmonic valve regurgitation is not visualized. No evidence of pulmonic stenosis. Aorta: The aortic root is normal in size and structure. Venous: The inferior vena cava is dilated in size with less than 50% respiratory variability, suggesting right atrial pressure of 15 mmHg. IAS/Shunts: No atrial level shunt detected by color flow Doppler.  LEFT VENTRICLE PLAX 2D LVIDd:         5.10 cm   Diastology LVIDs:         3.30 cm   LV e' medial:    10.20 cm/s LV PW:         0.80 cm   LV E/e' medial:  10.9 LV IVS:        0.90 cm   LV e' lateral:   10.30 cm/s LVOT diam:     2.00 cm   LV E/e' lateral: 10.8 LV SV:         91 LV SV Index:   46 LVOT Area:     3.14 cm  RIGHT VENTRICLE             IVC RV S prime:     15.90 cm/s  IVC diam: 2.10 cm TAPSE (M-mode): 3.8 cm LEFT ATRIUM              Index        RIGHT ATRIUM           Index LA Vol (A2C):   115.0 ml 58.38 ml/m  RA Area:     21.00 cm LA Vol (A4C):   78.3 ml  39.75 ml/m  RA Volume:   60.40 ml  30.66 ml/m LA Biplane Vol: 97.2 ml  49.35 ml/m  AORTIC VALVE AV Area (  Vmax):    2.98 cm AV Area (Vmean):   3.10 cm AV Area (VTI):     3.21 cm AV Vmax:           136.00 cm/s AV Vmean:          97.200 cm/s AV VTI:            0.283 m AV Peak Grad:      7.4 mmHg AV Mean Grad:      4.0 mmHg LVOT Vmax:         129.00 cm/s LVOT Vmean:        96.000 cm/s LVOT VTI:          0.289 m LVOT/AV VTI ratio: 1.02  AORTA Ao Root diam: 3.10 cm MITRAL VALVE MV Area (PHT): 3.66 cm     SHUNTS MV Decel Time: 207 msec     Systemic VTI:  0.29 m MV E velocity: 111.00 cm/s  Systemic Diam: 2.00 cm MV A velocity: 96.40 cm/s MV E/A ratio:  1.15 Soyla Merck MD Electronically signed by Soyla Merck MD Signature Date/Time: 07/08/2024/5:13:06 PM    Final    CT Angio Chest PE W and/or Wo Contrast Result Date: 07/07/2024 CLINICAL DATA:  Pulmonary embolism (PE) suspected, high prob, lower extremity edema, DVT EXAM: CT ANGIOGRAPHY CHEST WITH CONTRAST TECHNIQUE: Multidetector CT imaging of the chest was performed using the standard protocol during bolus administration of intravenous contrast. Multiplanar CT image reconstructions and MIPs were obtained to evaluate the vascular anatomy. RADIATION DOSE REDUCTION: This exam was performed according to the departmental dose-optimization program which includes automated exposure control, adjustment of the mA and/or kV according to patient size and/or use of iterative reconstruction technique. CONTRAST:  75mL OMNIPAQUE IOHEXOL 350 MG/ML SOLN COMPARISON:  10/18/2017, 07/07/2024 FINDINGS: Cardiovascular: There is suboptimal opacification of the segmental and subsegmental branches of the pulmonary artery due to timing of contrast bolus. There is somewhat heterogeneous opacification within the left upper and left lower lobe segmental vessels, equivocal for pulmonary embolus. No central or lobar emboli are identified. The heart is unremarkable without pericardial effusion. Normal caliber of the thoracic aorta. Mediastinum/Nodes: No enlarged mediastinal, hilar, or axillary lymph nodes. Thyroid  gland, trachea, and esophagus demonstrate no significant findings. Lungs/Pleura: No acute airspace disease, effusion, or pneumothorax. Central airways are patent. Upper Abdomen: Calcified gallstones without cholecystitis. Musculoskeletal: No acute or destructive bony abnormalities. Reconstructed images demonstrate no additional findings. Review of the MIP images confirms the above findings. IMPRESSION: 1. Limited evaluation of the segmental and subsegmental pulmonary arterial branches due to timing of contrast bolus. 2. Somewhat heterogeneous opacification within the left upper  and left lower lobe segmental pulmonary arterial branches, equivocal for pulmonary embolus given limitations due to timing of contrast bolus described above. No central or lobar pulmonary emboli are identified. 3. No acute airspace disease. 4. Incidental cholelithiasis without cholecystitis. Electronically Signed   By: Ozell Daring M.D.   On: 07/07/2024 14:59   DG Chest Port 1 View Result Date: 07/07/2024 CLINICAL DATA:  Leg swelling. EXAM: PORTABLE CHEST 1 VIEW COMPARISON:  October 18, 2017 FINDINGS: The heart size and mediastinal contours are within normal limits. Mild atelectatic changes are suspected within the right lung base. No acute infiltrate, pleural effusion or pneumothorax is identified. Multilevel degenerative changes are present throughout the thoracic spine. IMPRESSION: No active disease. Electronically Signed   By: Suzen Dials M.D.   On: 07/07/2024 12:24   US  Venous Img Lower Bilateral (DVT) Result Date: 07/07/2024 CLINICAL  DATA:  Bilateral lower extremity pain, redness and swelling. EXAM: BILATERAL LOWER EXTREMITY VENOUS DOPPLER ULTRASOUND TECHNIQUE: Gray-scale sonography with graded compression, as well as color Doppler and duplex ultrasound were performed to evaluate the lower extremity deep venous systems from the level of the common femoral vein and including the common femoral, femoral, profunda femoral, popliteal and calf veins including the posterior tibial, peroneal and gastrocnemius veins when visible. The superficial great saphenous vein was also interrogated. Spectral Doppler was utilized to evaluate flow at rest and with distal augmentation maneuvers in the common femoral, femoral and popliteal veins. COMPARISON:  None Available. FINDINGS: RIGHT LOWER EXTREMITY Common Femoral Vein: No evidence of thrombus. Normal compressibility, respiratory phasicity and response to augmentation. Saphenofemoral Junction: No evidence of thrombus. Normal compressibility and flow on color  Doppler imaging. Profunda Femoral Vein: No evidence of thrombus. Normal compressibility and flow on color Doppler imaging. Femoral Vein: There is evidence of nonocclusive thrombus within the mid to distal portion of the RIGHT femoral vein, with abnormal compressibility, respiratory phasicity and response to augmentation. Popliteal Vein: There is evidence of nonocclusive thrombus within the RIGHT popliteal vein, with abnormal compressibility, respiratory phasicity and response to augmentation. Calf Veins: The RIGHT posterior tibial vein is limited in visualization. There is evidence of nonocclusive thrombus within the RIGHT peroneal vein, with abnormal compressibility and flow on color Doppler imaging. Superficial Great Saphenous Vein: No evidence of thrombus. Normal compressibility. Venous Reflux:  None. Other Findings:  None. LEFT LOWER EXTREMITY Common Femoral Vein: No evidence of thrombus. Normal compressibility, respiratory phasicity and response to augmentation. Saphenofemoral Junction: No evidence of thrombus. Normal compressibility and flow on color Doppler imaging. Profunda Femoral Vein: No evidence of thrombus. Normal compressibility and flow on color Doppler imaging. Femoral Vein: There is evidence of nonocclusive thrombus within the distal aspect of the LEFT femoral vein, with abnormal compressibility, respiratory phasicity and response to augmentation. Popliteal Vein: There is evidence of nonocclusive thrombus within the LEFT popliteal vein, with abnormal compressibility, respiratory phasicity and response to augmentation. Calf Veins: The LEFT posterior tibial vein and LEFT peroneal vein are limited in visualization. Superficial Great Saphenous Vein: No evidence of thrombus. Normal compressibility. Venous Reflux:  None. Other Findings: There is evidence of occlusive thrombus within the LEFT gastrocnemius vein. IMPRESSION: 1. Limited evaluation of the BILATERAL posterior tibial veins and LEFT peroneal  vein. 2. Nonocclusive thrombus within the BILATERAL femoral veins, BILATERAL popliteal veins and RIGHT peroneal vein. 3. Occlusive thrombus within the LEFT gastrocnemius vein. Electronically Signed   By: Suzen Dials M.D.   On: 07/07/2024 12:23       The results of significant diagnostics from this hospitalization (including imaging, microbiology, ancillary and laboratory) are listed below for reference.     Microbiology: No results found for this or any previous visit (from the past 240 hours).   Labs:  CBC: Recent Labs  Lab 07/07/24 1144 07/08/24 0319 07/09/24 0321  WBC 4.2 4.0 3.9*  NEUTROABS 2.2 1.8  --   HGB 12.2* 12.0* 12.0*  HCT 36.2* 36.5* 36.2*  MCV 90.7 90.3 90.7  PLT 122* 111* 116*   BMP &GFR Recent Labs  Lab 07/07/24 1144 07/08/24 0319  NA 142 141  K 4.6 3.6  CL 105 107  CO2 28 26  GLUCOSE 86 94  BUN 14 12  CREATININE 0.96 1.00  CALCIUM 10.0 9.1   Estimated Creatinine Clearance: 77.9 mL/min (by C-G formula based on SCr of 1 mg/dL). Liver & Pancreas: Recent Labs  Lab 07/07/24  1144 07/08/24 0319  AST 22 15  ALT 13 11  ALKPHOS 57 43  BILITOT 1.0 1.3*  PROT 6.5 5.5*  ALBUMIN 3.7 2.7*   No results for input(s): LIPASE, AMYLASE in the last 168 hours. No results for input(s): AMMONIA in the last 168 hours. Diabetic: No results for input(s): HGBA1C in the last 72 hours. No results for input(s): GLUCAP in the last 168 hours. Cardiac Enzymes: No results for input(s): CKTOTAL, CKMB, CKMBINDEX, TROPONINI in the last 168 hours. Recent Labs    07/07/24 1144  PROBNP 169.0   Coagulation Profile: No results for input(s): INR, PROTIME in the last 168 hours. Thyroid  Function Tests: Recent Labs    07/07/24 2143  TSH 1.117   Lipid Profile: No results for input(s): CHOL, HDL, LDLCALC, TRIG, CHOLHDL, LDLDIRECT in the last 72 hours. Anemia Panel: Recent Labs    07/08/24 0319  VITAMINB12 205  FOLATE >20.0   FERRITIN 46  TIBC 277  IRON 67  RETICCTPCT 1.2   Urine analysis:    Component Value Date/Time   COLORURINE YELLOW 01/23/2014 1345   APPEARANCEUR CLEAR 01/23/2014 1345   LABSPEC 1.015 01/23/2014 1345   PHURINE 5.5 01/23/2014 1345   GLUCOSEU NEGATIVE 01/23/2014 1345   HGBUR NEGATIVE 01/23/2014 1345   BILIRUBINUR NEGATIVE 01/23/2014 1345   KETONESUR NEGATIVE 01/23/2014 1345   PROTEINUR NEGATIVE 01/23/2014 1345   UROBILINOGEN 1.0 01/23/2014 1345   NITRITE NEGATIVE 01/23/2014 1345   LEUKOCYTESUR NEGATIVE 01/23/2014 1345   Sepsis Labs: Invalid input(s): PROCALCITONIN, LACTICIDVEN   SIGNED:  Mignon ONEIDA Bump, MD  [facility] 07/09/2024, 9:27 AM

## 2024-07-09 NOTE — Discharge Instructions (Signed)
 Information on my medicine - ELIQUIS (apixaban)  This medication education was reviewed with me or my healthcare representative as part of my discharge preparation.  The pharmacist that spoke with me during my hospital stay was:  Denna Harlene BROCKS, [facility]  Why was Eliquis prescribed for you? Eliquis was prescribed to treat blood clots that may have been found in the veins of your legs (deep vein thrombosis) or in your lungs (pulmonary embolism) and to reduce the risk of them occurring again.  What do You need to know about Eliquis ? The starting dose is 10 mg (two 5 mg tablets) taken TWICE daily for the FIRST SEVEN (7) DAYS, then on (enter date)  07/16/2024  the dose is reduced to ONE 5 mg tablet taken TWICE daily.  Eliquis may be taken with or without food.   Try to take the dose about the same time in the morning and in the evening. If you have difficulty swallowing the tablet whole please discuss with your pharmacist how to take the medication safely.  Take Eliquis exactly as prescribed and DO NOT stop taking Eliquis without talking to the doctor who prescribed the medication.  Stopping may increase your risk of developing a new blood clot.  Refill your prescription before you run out.  After discharge, you should have regular check-up appointments with your healthcare provider that is prescribing your Eliquis.    What do you do if you miss a dose? If a dose of ELIQUIS is not taken at the scheduled time, take it as soon as possible on the same day and twice-daily administration should be resumed. The dose should not be doubled to make up for a missed dose.  Important Safety Information A possible side effect of Eliquis is bleeding. You should call your healthcare provider right away if you experience any of the following: Bleeding from an injury or your nose that does not stop. Unusual colored urine (red or dark brown) or unusual colored stools (red or black). Unusual bruising for  unknown reasons. A serious fall or if you hit your head (even if there is no bleeding).  Some medicines may interact with Eliquis and might increase your risk of bleeding or clotting while on Eliquis. To help avoid this, consult your healthcare provider or pharmacist prior to using any new prescription or non-prescription medications, including herbals, vitamins, non-steroidal anti-inflammatory drugs (NSAIDs) and supplements.  This website has more information on Eliquis (apixaban): http://www.eliquis.com/eliquis/home

## 2024-07-09 NOTE — Progress Notes (Signed)
   07/09/24 0954  TOC Brief Assessment  Insurance and Status Reviewed  Patient has primary care physician Yes  Home environment has been reviewed home  Prior level of function: self/independent  Prior/Current Home Services No current home services  Social Drivers of Health Review SDOH reviewed no interventions necessary  Readmission risk has been reviewed Yes  Transition of care needs no transition of care needs at this time    Pt stable for transition home today, new Eliquis- will get his starter pack with trial coupon from [facility] pharmacy prior to leaving hospital. No IP CM needs noted.

## 2024-07-09 NOTE — Telephone Encounter (Signed)
 Patient Product/process development scientist completed.    The patient is insured through [facility]. Patient has Medicare and is not eligible for a copay card, but may be able to apply for patient assistance or Medicare RX Payment Plan (Patient Must reach out to their plan, if eligible for payment plan), if available.    Ran test claim for Eliquis 5 mg and the current 30 day co-pay is $75.00  Ran test claim for Xarelto  20 mg and the current 30 day co-pay is $75.00.    This test claim was processed through [facility]- copay amounts may vary at other pharmacies due to pharmacy/plan contracts, or as the patient moves through the different stages of their insurance plan.     Reyes Sharps, CPHT Pharmacy Technician Patient Advocate Specialist Lead [facility] Patient Advocate Team Direct Number: [phone]  Fax: [phone]

## 2024-07-09 NOTE — Progress Notes (Signed)
 Discharge instructions provided to the patient, he is aware regarding follow up appointments and TOC meds, PIV removed, CCMD notified, patient will be transferred to d/c lounge, will let the d/c lounge staffs to pick up his TOC meds. No any concerns during d/c.

## 2024-07-09 NOTE — Progress Notes (Addendum)
 PHARMACY - ANTICOAGULATION CONSULT NOTE  Pharmacy Consult for heparin  > Eliquis Indication: pulmonary embolus  No Known Allergies  Patient Measurements: Height: 5' 7.01 (170.2 cm) Weight: 85.3 kg (188 lb) IBW/kg (Calculated) : 66.12 HEPARIN  DW (KG): 83.4  Vital Signs: Temp: 97.6 F (36.4 C) (10/21 0811) Temp Source: Oral (10/21 0811) BP: 129/78 (10/21 0811) Pulse Rate: 48 (10/21 0811)  Labs: Recent Labs    07/07/24 1144 07/07/24 2143 07/07/24 2355 07/07/24 2355 07/08/24 0319 07/08/24 0847 07/09/24 0321  HGB 12.2*  --   --   --  12.0*  --  12.0*  HCT 36.2*  --   --   --  36.5*  --  36.2*  PLT 122*  --   --   --  111*  --  116*  APTT  --   --  151*  --   --   --   --   HEPARINUNFRC  --   --  0.42   < > 0.38 0.60 0.46  CREATININE 0.96  --   --   --  1.00  --   --   TROPONINIHS  --  6 6  --   --   --   --    < > = values in this interval not displayed.    Estimated Creatinine Clearance: 77.9 mL/min (by C-G formula based on SCr of 1 mg/dL).   Medical History: Past Medical History:  Diagnosis Date   DVT (deep venous thrombosis) (HCC)    Hypertension     Medications:  Medications Prior to Admission  Medication Sig Dispense Refill Last Dose/Taking   furosemide  (LASIX ) 20 MG tablet Take 1 tablet (20 mg total) by mouth daily. 30 tablet 11    lisinopril  (PRINIVIL ,ZESTRIL ) 5 MG tablet Take 2 tablets (10 mg total) by mouth daily. 60 tablet 1    potassium chloride  (K-DUR) 10 MEQ tablet Take 1 tablet (10 mEq total) by mouth daily. 30 tablet 0    RIVAROXABAN  (XARELTO ) VTE STARTER PACK (15 & 20 MG) Follow package directions: Take one 15mg  tablet by mouth twice a day. On day 22, switch to one 20mg  tablet once a day. Take with food. 51 each 0     Assessment: 25 YOM with a history of DVT. Found to have bilateral DVT and CT angio cannot rule out peripheral PEs. Does not appear to still be on oral anticoagulation PTA.  Heparin  level remains therapeutic at 0.46 this morning.  No issues with infusion or bleeding reported. CBC stable  Pharmacy asked to transition to Eliquis today.  Goal of Therapy:  Heparin  level 0.3-0.7 units/ml Monitor platelets by anticoagulation protocol: Yes   Plan:  Stop IV heparin   Start Eliquis 10 mg po BID x 7 days, followed by Eliquis 5 mg po BID. Checking Eliquis copay.   Harlene Barlow, Berd[employer], BCCP Clinical Pharmacist  07/09/2024 8:16 AM   [facility] pharmacy phone numbers are listed on [email or web address]

## 2024-07-16 ENCOUNTER — Inpatient Hospital Stay

## 2024-07-16 ENCOUNTER — Inpatient Hospital Stay: Admitting: Family

## 2024-08-06 ENCOUNTER — Other Ambulatory Visit (HOSPITAL_COMMUNITY): Payer: Self-pay

## 2024-08-09 ENCOUNTER — Other Ambulatory Visit: Payer: Self-pay | Admitting: Family

## 2024-08-09 ENCOUNTER — Encounter: Payer: Self-pay | Admitting: Family

## 2024-08-09 ENCOUNTER — Inpatient Hospital Stay: Attending: Hematology & Oncology

## 2024-08-09 ENCOUNTER — Inpatient Hospital Stay: Admitting: Family

## 2024-08-09 VITALS — BP 111/69 | HR 51 | Temp 98.1°F | Resp 18 | Ht 67.0 in | Wt 197.1 lb

## 2024-08-09 DIAGNOSIS — I824Y3 Acute embolism and thrombosis of unspecified deep veins of proximal lower extremity, bilateral: Secondary | ICD-10-CM

## 2024-08-09 DIAGNOSIS — I2699 Other pulmonary embolism without acute cor pulmonale: Secondary | ICD-10-CM | POA: Insufficient documentation

## 2024-08-09 DIAGNOSIS — I82401 Acute embolism and thrombosis of unspecified deep veins of right lower extremity: Secondary | ICD-10-CM

## 2024-08-09 DIAGNOSIS — I82502 Chronic embolism and thrombosis of unspecified deep veins of left lower extremity: Secondary | ICD-10-CM | POA: Insufficient documentation

## 2024-08-09 DIAGNOSIS — Z7901 Long term (current) use of anticoagulants: Secondary | ICD-10-CM | POA: Insufficient documentation

## 2024-08-09 LAB — CBC WITH DIFFERENTIAL (CANCER CENTER ONLY)
Abs Immature Granulocytes: 0.01 K/uL (ref 0.00–0.07)
Basophils Absolute: 0 K/uL (ref 0.0–0.1)
Basophils Relative: 1 %
Eosinophils Absolute: 0.1 K/uL (ref 0.0–0.5)
Eosinophils Relative: 2 %
HCT: 41.6 % (ref 39.0–52.0)
Hemoglobin: 13.8 g/dL (ref 13.0–17.0)
Immature Granulocytes: 0 %
Lymphocytes Relative: 39 %
Lymphs Abs: 1.3 K/uL (ref 0.7–4.0)
MCH: 30 pg (ref 26.0–34.0)
MCHC: 33.2 g/dL (ref 30.0–36.0)
MCV: 90.4 fL (ref 80.0–100.0)
Monocytes Absolute: 0.2 K/uL (ref 0.1–1.0)
Monocytes Relative: 7 %
Neutro Abs: 1.6 K/uL — ABNORMAL LOW (ref 1.7–7.7)
Neutrophils Relative %: 51 %
Platelet Count: 143 K/uL — ABNORMAL LOW (ref 150–400)
RBC: 4.6 MIL/uL (ref 4.22–5.81)
RDW: 14.1 % (ref 11.5–15.5)
WBC Count: 3.2 K/uL — ABNORMAL LOW (ref 4.0–10.5)
nRBC: 0 % (ref 0.0–0.2)

## 2024-08-09 LAB — CMP (CANCER CENTER ONLY)
ALT: 10 U/L (ref 0–44)
AST: 23 U/L (ref 15–41)
Albumin: 4.2 g/dL (ref 3.5–5.0)
Alkaline Phosphatase: 60 U/L (ref 38–126)
Anion gap: 9 (ref 5–15)
BUN: 13 mg/dL (ref 8–23)
CO2: 27 mmol/L (ref 22–32)
Calcium: 10.1 mg/dL (ref 8.9–10.3)
Chloride: 104 mmol/L (ref 98–111)
Creatinine: 0.94 mg/dL (ref 0.61–1.24)
GFR, Estimated: >60 mL/min (ref >60)
Glucose, Bld: 82 mg/dL (ref 70–99)
Potassium: 3.9 mmol/L (ref 3.5–5.1)
Sodium: 140 mmol/L (ref 135–145)
Total Bilirubin: 1.5 mg/dL — ABNORMAL HIGH (ref 0.0–1.2)
Total Protein: 7.4 g/dL (ref 6.5–8.1)

## 2024-08-09 LAB — ANTITHROMBIN III: AntiThromb III Func: 97 % (ref 75–120)

## 2024-08-09 LAB — LACTATE DEHYDROGENASE: LDH: 140 U/L (ref 105–235)

## 2024-08-09 MED ORDER — APIXABAN 5 MG PO TABS: 5.0000 mg | ORAL_TABLET | Freq: Two times a day (BID) | ORAL | 1 refills | Status: AC

## 2024-08-09 NOTE — Progress Notes (Signed)
 Hematology/Oncology Consultation   Name: Richard Leon      MRN: [id number]    Location: Room/bed info not found  Date: 08/09/2024 Time:12:50 PM   REFERRING PHYSICIAN: Hospital referral  REASON FOR CONSULT:  DVT   DIAGNOSIS:  Recurrent LLE DVT and new RLE DVT PE  HISTORY OF PRESENT ILLNESS:  Richard Leon is a very pleasant 63 yo African American gentleman with history of LLE chronic DVT.  He was seen by Dr. Timmy for his first recurrent DVT in 05/2015 and was recommended to stay on lifelong anticoagulation.   He was on Xarelto  for the chronic LLE DVT but stopped taking a couple years ago.  He states that he ran out of his antihypertensive which also contains hydrochlorothiazide  and then developed increased swelling and pain in his legs. He went to the ED in October and was confirmed to have PE in both the left upper and lower lobes as well as bilateral lower extremity DVTs.  He states that he had also travelled to [place] for vacation in September.  No illness/Covid or injury leading up to diagnosis.  Hyper coag panel pending.  No smoking,ETOH or recreational drug use.  He does not use any herbal or work out supplements.  No testosterone or hormone replacement therapy use.  His mother also had history of recurrent DVTs.  He was wearing a compression stocking and will resume when exercising and if he is on his feet for an extended period of time or travelling.  There is still some swelling in his lower extremities but this has improved tremendously per patient and he is no longer having pain. Pedal pulses are 1+. No falls or syncope reported.  He is back at the gym exercising and taking his time starting to run again. He is avoiding intense workout right now.  No history of sickle cell disease or trait.  No personal or known familial history of cancer.  No history of diabetes or thyroid  disease.  No fever, chills, n/v, cough, rash, dizziness, SOB, chest pain, palpitations,  abdominal pain or changes in bowel or bladder habits.   Appetite and hydration are good. Weight is stable at 122 lbs.   ROS: All other 10 point review of systems is negative.   PAST MEDICAL HISTORY:   Past Medical History:  Diagnosis Date   DVT (deep venous thrombosis) (HCC)    Hypertension     ALLERGIES: No Known Allergies    MEDICATIONS:  Current Outpatient Medications on File Prior to Visit  Medication Sig Dispense Refill   APIXABAN  (ELIQUIS ) VTE STARTER PACK (10MG  AND 5MG ) Take as directed on package: start with two-5mg  tablets twice daily for 7 days. On day 8, switch to one-5mg  tablet twice daily. 74 each 0   lisinopril -hydrochlorothiazide  (ZESTORETIC ) 20-25 MG tablet Take 1 tablet by mouth daily. 30 tablet 3   apixaban  (ELIQUIS ) 5 MG TABS tablet Take 1 tablet (5 mg total) by mouth 2 (two) times daily. Start when you finish starter pack (Patient not taking: Reported on 08/09/2024) 180 tablet 1   No current facility-administered medications on file prior to visit.     PAST SURGICAL HISTORY Past Surgical History:  Procedure Laterality Date   None      FAMILY HISTORY: Family History  Problem Relation Age of Onset   Arthritis Mother    Hypertension Mother    COPD Mother    Kidney disease Sister    Cancer Brother     SOCIAL HISTORY:  reports  that he has never smoked. He has never used smokeless tobacco. He reports that he does not drink alcohol and does not use drugs.  PERFORMANCE STATUS: The patient's performance status is 1 - Symptomatic but completely ambulatory  PHYSICAL EXAM: Most Recent Vital Signs: Blood pressure 111/69, pulse (!) 51, temperature 98.1 F (36.7 C), temperature source Oral, resp. rate 18, height 5' 7 (1.702 m), weight 197 lb 1.3 oz (89.4 kg), SpO2 100%. BP 111/69 (BP Location: Right Arm, Patient Position: Sitting)   Pulse (!) 51   Temp 98.1 F (36.7 C) (Oral)   Resp 18   Ht 5' 7 (1.702 m)   Wt 197 lb 1.3 oz (89.4 kg)   SpO2 100%   BMI  30.87 kg/m   General Appearance:    Alert, cooperative, no distress, appears stated age  Head:    Normocephalic, without obvious abnormality, atraumatic  Eyes:    PERRL, conjunctiva/corneas clear, EOM's intact, fundi    benign, both eyes             Throat:   Lips, mucosa, and tongue normal; teeth and gums normal  Neck:   Supple, symmetrical, trachea midline, no adenopathy;       thyroid :  No enlargement/tenderness/nodules; no carotid   bruit or JVD  Back:     Symmetric, no curvature, ROM normal, no CVA tenderness  Lungs:     Clear to auscultation bilaterally, respirations unlabored  Chest wall:    No tenderness or deformity  Heart:    Regular rate and rhythm, S1 and S2 normal, no murmur, rub   or gallop  Abdomen:     Soft, non-tender, bowel sounds active all four quadrants,    no masses, no organomegaly        Extremities:   Extremities normal, atraumatic, no cyanosis or edema  Pulses:   2+ and symmetric all extremities  Skin:   Skin color, texture, turgor normal, no rashes or lesions  Lymph nodes:   Cervical, supraclavicular, and axillary nodes normal  Neurologic:   CNII-XII intact. Normal strength, sensation and reflexes      throughout    LABORATORY DATA:  Results for orders placed or performed in visit on 08/09/24 (from the past 48 hours)  CMP (Cancer Center only)     Status: Abnormal   Collection Time: 08/09/24 10:24 AM  Result Value Ref Range   Sodium 140 135 - 145 mmol/L   Potassium 3.9 3.5 - 5.1 mmol/L   Chloride 104 98 - 111 mmol/L   CO2 27 22 - 32 mmol/L   Glucose, Bld 82 70 - 99 mg/dL    Comment: Glucose reference range applies only to samples taken after fasting for at least 8 hours.   BUN 13 8 - 23 mg/dL   Creatinine 9.05 9.38 - 1.24 mg/dL   Calcium 89.8 8.9 - 89.6 mg/dL   Total Protein 7.4 6.5 - 8.1 g/dL   Albumin 4.2 3.5 - 5.0 g/dL   AST 23 15 - 41 U/L   ALT 10 0 - 44 U/L   Alkaline Phosphatase 60 38 - 126 U/L   Total Bilirubin 1.5 (H) 0.0 - 1.2 mg/dL    GFR, Estimated >39 >39 mL/min    Comment: (NOTE) Calculated using the CKD-EPI Creatinine Equation (2021)    Anion gap 9 5 - 15    Comment: Performed at [facility], [address] [place], [place] [address]  CBC with Differential (Cancer Center Only)  Status: Abnormal   Collection Time: 08/09/24 10:24 AM  Result Value Ref Range   WBC Count 3.2 (L) 4.0 - 10.5 K/uL   RBC 4.60 4.22 - 5.81 MIL/uL   Hemoglobin 13.8 13.0 - 17.0 g/dL   HCT 58.3 60.9 - 47.9 %   MCV 90.4 80.0 - 100.0 fL   MCH 30.0 26.0 - 34.0 pg   MCHC 33.2 30.0 - 36.0 g/dL   RDW 85.8 88.4 - 84.4 %   Platelet Count 143 (L) 150 - 400 K/uL    Comment: PLATELET COUNT CONFIRMED BY SMEAR   nRBC 0.0 0.0 - 0.2 %   Neutrophils Relative % 51 %   Neutro Abs 1.6 (L) 1.7 - 7.7 K/uL   Lymphocytes Relative 39 %   Lymphs Abs 1.3 0.7 - 4.0 K/uL   Monocytes Relative 7 %   Monocytes Absolute 0.2 0.1 - 1.0 K/uL   Eosinophils Relative 2 %   Eosinophils Absolute 0.1 0.0 - 0.5 K/uL   Basophils Relative 1 %   Basophils Absolute 0.0 0.0 - 0.1 K/uL   Immature Granulocytes 0 %   Abs Immature Granulocytes 0.01 0.00 - 0.07 K/uL    Comment: Performed at [facility], [address] [place], [place] [address]  Lactate dehydrogenase (LDH)     Status: None   Collection Time: 08/09/24 10:25 AM  Result Value Ref Range   LDH 140 105 - 235 U/L    Comment: Please note change in reference range. Performed at [facility], [address] [place], [place] [address]       RADIOGRAPHY: No results found.     PATHOLOGY: None   ASSESSMENT/PLAN: Mr. Villena is a very pleasant 64 yo African American gentleman with multiple recurrent DVTs in the left leg most recently last month along with new RLE DVT and PE.  He will continue his same regimen with Eliquis  5 mg PO BID.  Hyper coag panel pending.  We will repeat CTA and bilateral lower extremity US  at 2 month follow-up.   All questions were answered. The patient  knows to call the clinic with any problems, questions or concerns. We can certainly see the patient much sooner if necessary.   Lauraine Pepper, NP

## 2024-08-10 LAB — HOMOCYSTEINE: Homocysteine: 12.4 umol/L (ref 0.0–17.2)

## 2024-08-11 LAB — BETA-2-GLYCOPROTEIN I ABS, IGG/M/A
Beta-2 Glyco I IgG: <9 GPI IgG units (ref 0–20)
Beta-2-Glycoprotein I IgA: <9 GPI IgA units (ref 0–25)
Beta-2-Glycoprotein I IgM: <9 GPI IgM units (ref 0–32)

## 2024-08-12 LAB — DRVVT CONFIRM: dRVVT Confirm: 0.9 ratio (ref 0.8–1.2)

## 2024-08-12 LAB — LUPUS ANTICOAGULANT PANEL
DRVVT: 55 s — ABNORMAL HIGH (ref 0.0–47.0)
PTT Lupus Anticoagulant: 37 s (ref 0.0–43.5)

## 2024-08-12 LAB — PROTEIN C ACTIVITY: Protein C Activity: 69 % — ABNORMAL LOW (ref 73–180)

## 2024-08-12 LAB — DRVVT MIX: dRVVT Mix: 46 s — ABNORMAL HIGH (ref 0.0–40.4)

## 2024-08-12 LAB — PROTEIN S ACTIVITY: Protein S Activity: 71 % (ref 63–140)

## 2024-08-12 LAB — PROTEIN S, TOTAL: Protein S Ag, Total: 87 % (ref 60–150)

## 2024-08-13 LAB — PROTEIN C, TOTAL: Protein C, Total: 69 % (ref 60–150)

## 2024-08-14 LAB — FACTOR 5 LEIDEN

## 2024-08-14 LAB — PROTHROMBIN GENE MUTATION

## 2024-08-16 LAB — CARDIOLIPIN ANTIBODIES, IGG, IGM, IGA
Anticardiolipin IgA: <9 U/mL (ref 0–11)
Anticardiolipin IgG: <9 GPL U/mL (ref 0–14)
Anticardiolipin IgM: <9 [MPL'U]/mL (ref 0–12)

## 2024-08-22 ENCOUNTER — Encounter: Payer: Self-pay | Admitting: Family

## 2024-09-23 ENCOUNTER — Telehealth: Payer: Self-pay | Admitting: *Deleted

## 2024-09-23 NOTE — Telephone Encounter (Signed)
 Faxed CTA orders to [facility] per [employer] request.  Auth # is [id number]. Faxed to [phone].

## 2024-10-04 ENCOUNTER — Inpatient Hospital Stay: Admitting: Family

## 2024-10-04 ENCOUNTER — Inpatient Hospital Stay

## 2024-10-04 ENCOUNTER — Ambulatory Visit (HOSPITAL_BASED_OUTPATIENT_CLINIC_OR_DEPARTMENT_OTHER)
